# Patient Record
Sex: Male | Born: 1980 | Race: Black or African American | Hispanic: No | Marital: Single | Smoking: Current every day smoker
Health system: Southern US, Community
[De-identification: ages and names within clinical notes are randomized; demographics above are authoritative.]

## PROBLEM LIST (undated history)

## (undated) ENCOUNTER — Emergency Department (HOSPITAL_COMMUNITY): Admission: EM | Payer: Self-pay | Source: Home / Self Care

## (undated) DIAGNOSIS — Z8719 Personal history of other diseases of the digestive system: Secondary | ICD-10-CM

## (undated) DIAGNOSIS — Z8711 Personal history of peptic ulcer disease: Secondary | ICD-10-CM

---

## 1998-11-08 ENCOUNTER — Emergency Department (HOSPITAL_COMMUNITY): Admission: EM | Admit: 1998-11-08 | Discharge: 1998-11-08 | Payer: Self-pay | Admitting: Emergency Medicine

## 1999-04-04 ENCOUNTER — Emergency Department (HOSPITAL_COMMUNITY): Admission: EM | Admit: 1999-04-04 | Discharge: 1999-04-04 | Payer: Self-pay | Admitting: Emergency Medicine

## 2002-06-23 ENCOUNTER — Encounter: Payer: Self-pay | Admitting: Emergency Medicine

## 2002-06-23 ENCOUNTER — Emergency Department (HOSPITAL_COMMUNITY): Admission: EM | Admit: 2002-06-23 | Discharge: 2002-06-23 | Payer: Self-pay | Admitting: Emergency Medicine

## 2003-10-07 ENCOUNTER — Emergency Department (HOSPITAL_COMMUNITY): Admission: EM | Admit: 2003-10-07 | Discharge: 2003-10-07 | Payer: Self-pay | Admitting: Emergency Medicine

## 2004-03-19 ENCOUNTER — Emergency Department (HOSPITAL_COMMUNITY): Admission: EM | Admit: 2004-03-19 | Discharge: 2004-03-19 | Payer: Self-pay | Admitting: Emergency Medicine

## 2005-07-18 ENCOUNTER — Emergency Department (HOSPITAL_COMMUNITY): Admission: EM | Admit: 2005-07-18 | Discharge: 2005-07-18 | Payer: Self-pay | Admitting: Emergency Medicine

## 2006-02-05 ENCOUNTER — Emergency Department (HOSPITAL_COMMUNITY): Admission: EM | Admit: 2006-02-05 | Discharge: 2006-02-05 | Payer: Self-pay | Admitting: Emergency Medicine

## 2007-08-18 ENCOUNTER — Emergency Department (HOSPITAL_COMMUNITY): Admission: EM | Admit: 2007-08-18 | Discharge: 2007-08-18 | Payer: Self-pay | Admitting: Emergency Medicine

## 2007-08-31 ENCOUNTER — Emergency Department (HOSPITAL_COMMUNITY): Admission: EM | Admit: 2007-08-31 | Discharge: 2007-08-31 | Payer: Self-pay | Admitting: Emergency Medicine

## 2007-12-23 ENCOUNTER — Emergency Department (HOSPITAL_COMMUNITY): Admission: EM | Admit: 2007-12-23 | Discharge: 2007-12-23 | Payer: Self-pay | Admitting: Emergency Medicine

## 2008-05-04 ENCOUNTER — Inpatient Hospital Stay (HOSPITAL_COMMUNITY): Admission: EM | Admit: 2008-05-04 | Discharge: 2008-05-07 | Payer: Self-pay | Admitting: Emergency Medicine

## 2008-09-03 ENCOUNTER — Emergency Department (HOSPITAL_COMMUNITY): Admission: EM | Admit: 2008-09-03 | Discharge: 2008-09-03 | Payer: Self-pay | Admitting: Emergency Medicine

## 2009-08-29 ENCOUNTER — Emergency Department (HOSPITAL_COMMUNITY): Admission: EM | Admit: 2009-08-29 | Discharge: 2009-08-29 | Payer: Self-pay | Admitting: Emergency Medicine

## 2009-12-13 ENCOUNTER — Emergency Department (HOSPITAL_COMMUNITY): Admission: EM | Admit: 2009-12-13 | Discharge: 2009-12-13 | Payer: Self-pay | Admitting: Emergency Medicine

## 2010-02-20 ENCOUNTER — Emergency Department (HOSPITAL_COMMUNITY): Admission: EM | Admit: 2010-02-20 | Discharge: 2010-02-20 | Payer: Self-pay | Admitting: Family Medicine

## 2010-03-22 ENCOUNTER — Emergency Department (HOSPITAL_COMMUNITY): Admission: EM | Admit: 2010-03-22 | Discharge: 2010-03-22 | Payer: Self-pay | Admitting: Emergency Medicine

## 2011-04-02 NOTE — Consult Note (Signed)
[name], [name]                ACCOUNT NO.:  [id number]   MEDICAL RECORD NO.:  [id number]          PATIENT TYPE:  INP   LOCATION:  6740                         FACILITY:  [facility]   PHYSICIAN:  Jordan Hawks. Elnoria Howard, MD    DATE OF BIRTH:  07-23-1981   DATE OF CONSULTATION:  05/05/2008  DATE OF DISCHARGE:                                 CONSULTATION   REASON FOR CONSULTATION:  Melena and anemia.   REQUIRING PHYSICIAN:  [facility].   HISTORY OF PRESENT ILLNESS:  This is a 30 year old gentleman with a past  medical history of lower back pain who presents to the emergency room  with complaints of melena and also some dizziness.  He did not have any  chest pain or shortness of breath.  He states taking NSAID medication  about 2-3 tablets per day on a t.i.d. basis.  This started last Saturday  and subsequently on Tuesday he started noticing black stools.  The  stools are progressive and now is complaining with dizziness and with  ambulation.  Because of his symptoms, he presented to the emergency room  for further evaluation and treatment.  Subsequently, he is noted to have  a significant anemia with a hemoglobin in the upper 7 range.  Subsequently, he was transfused with 2 units of packed red blood cells  and admitted to the hospital.   PAST MEDICAL HISTORY:  As stated above.   PAST SURGICAL HISTORY:  As stated above.   FAMILY HISTORY:  Noncontributory.   SOCIAL HISTORY:  Negative for alcohol, tobacco, or illicit drug use.   ALLERGIES:  No known drug allergies.   REVIEW OF SYSTEMS:  As stated above in the history of present illness  otherwise negative.   MEDICATIONS:  1. Albuterol.  2. Atrovent.  3. Protonix.  4. Tylenol.  5. Bisacodyl.  6. Zofran.  7. Oxycodone.   PHYSICAL EXAMINATION:  VITAL SIGNS:  Blood pressure is 109/67, heart  rate is 85, respirations 20, and temperature is 98.0.  GENERAL:  The patient is in no acute distress.  Alert and oriented.  HEENT:   Normocephalic and atraumatic.  Extraocular muscles intact.  NECK:  Supple.  No lymphadenopathy.  LUNGS:  Clear to auscultation bilaterally.  CARDIOVASCULAR:  Regular rate and rhythm.  ABDOMEN:  Mildly obese, soft, some tenderness in the epigastric region.  No rebound or rigidity.  Positive bowel sounds.  EXTREMITIES:  No clubbing, cyanosis, or edema.   LABORATORY DATA:  White blood count is 14.2, hemoglobin 7.1, MCV is  86.3, and platelets 260.  Sodium is 141, potassium 3.7, chloride 109,  glucose 112, BUN 12, and creatinine is 1.0.  Urinalysis is negative.  Hemoccult positive.   IMPRESSION:  1. NSAID-induced ulcers and chronic back pain.  2. Constipation.  The patient certainly has a history consistent with      NSAID gastritis.  Further evaluation with an EGD will be required.      The patient continues to feel a little bit weak at this time and I      will transfuse him with you more  units of packed red blood cells as      his hemoglobin only increased 8.5 to 7.1.  He should remain on      Protonix at this time.      Jordan Hawks Elnoria Howard, MD  Electronically Signed     PDH/MEDQ  D:  05/05/2008  T:  05/06/2008  Job:  [id number]

## 2011-04-02 NOTE — H&P (Signed)
[name], [name] NO.:  [id number]   MEDICAL RECORD NO.:  [id number]           PATIENT TYPE:   LOCATION:                                 FACILITY:   PHYSICIAN:  Lucita Ferrara, MD         DATE OF BIRTH:  1981/08/30   DATE OF ADMISSION:  05/04/2008  DATE OF DISCHARGE:                              HISTORY & PHYSICAL   The patient is a 30 year old with no significant past medical history  who presents with a chief complaint of dizziness upon ambulation.  Symptoms have been going on with 3 days of constipation which he tried  to take some Ex-Lax for last night, accompanied by crampy abdominal  pain.  Patient states he has been taking a lot Motrin and muscle  relaxant for diffuse joint pains, and he noticed some black stools  earlier today.  His abdomen feels full, and he feels very dizzy.  He has  some nausea but no vomiting.  Otherwise, his 12-point review of systems  is negative.  He denies any chest pain, shortness of breath, fevers,  chills, weight loss, weakness, changes in appetite.  He denies  hematemesis, hematochezia.   PAST MEDICAL HISTORY:  Chronic back pain.   SOCIAL HISTORY:  Denies drugs, alcohol.  He is current smoker.   ALLERGIES:  No known drug allergies.   MEDICATIONS:  Include naproxen and ibuprofen.  He also takes Vicodin  unknown dose.   REVIEW OF SYSTEMS:  Otherwise negative.   PHYSICAL EXAMINATION:  Generally speaking, the patient is in no acute  distress.  Blood pressure 131/79, pulse 122, respirations 18,  temperature 97.  HEENT:  Normocephalic, atraumatic.  Sclerae anicteric.  NECK:  Supple.  No JVD or carotid bruits.  PERLA.  Extraocular movements  intact.  CARDIOVASCULAR:  S1-S2 tachy, regular rhythm.  LUNGS:  Clear to auscultation bilaterally.  No rhonchi, rales, rales or  wheezes.  ABDOMEN:  Is soft, nondistended.  There is some left lower quadrant mild  tenderness.  Positive bowel sounds.  EXTREMITIES:  No clubbing, cyanosis  or edema.  RECTAL:  Normal tone.  Stool color black, guaiac positive.  NEUROLOGICAL:  Patient is alert and oriented x3.  Nerves II-XII grossly  intact.  The patient was found to be quite anemic in the emergency room;  yet, he refused transfusion at this time.   LABORATORY DATA:  His basic metabolic panel is significant for a high  BUN of 32, creatinine 1, hemoglobin 7.8, hematocrit 23, white count is  14.2, platelets 160.  KUB is negative for obstruction or free peritoneal  air.   ASSESSMENT/PLAN:  A 30 year old otherwise healthy African-American male  who actually takes an excessive amount of ibuprofen/naproxen and  nonsteroidal anti-inflammatories for chronic back pain, presents with  black stools and dizziness, likely secondary to upper GI bleed.  1. Gastrointestinal bleed and acute blood loss anemia that is      symptomatic.  2. Leukocytosis that is likely reactive.  3. Chronic back pain.  4. Tobacco abuse.   PLAN:  Will go ahead and admit  the patient to medical telemetry floor,  monitor his hemodynamic status, type and cross and transfuse, Protonix  drip, GI consultation in the morning for likely endoscopy versus  endoscopy and colonoscopy.  His leukocytosis likely to be reactive.  His  urinalysis not done yet.  We will go ahead and send off urinalysis on  him.  Smoking cessation will be highly advised.  Also, patient will be  advised not to take too many nonsteroidal anti-inflammatories.  The rest  of the plans depend on his progress.  DVT prophylaxis with SCD boots.      Lucita Ferrara, MD  Electronically Signed     RR/MEDQ  D:  05/04/2008  T:  05/04/2008  Job:  [id number]

## 2011-04-02 NOTE — Discharge Summary (Signed)
[name], [name]                ACCOUNT NO.:  [id number]   MEDICAL RECORD NO.:  [id number]          PATIENT TYPE:  INP   LOCATION:  6740                         FACILITY:  [facility]   PHYSICIAN:  Herbie Saxon, MDDATE OF BIRTH:  Jul 10, 1981   DATE OF ADMISSION:  05/04/2008  DATE OF DISCHARGE:  05/07/2008                               DISCHARGE SUMMARY   DISCHARGE DIAGNOSES:  1. Duodenal ulcers.  2. Severe acute blood loss anemia, improved, status post blood      transfusion.  3. History of nonsteroidal antiinflammatory drug abuse.  4. Hypertension, resolved.  5. Leukocytosis, resolved.  6. History of tobacco abuse.  7. Thrombocytopenia, improved.  8. Remote history of gastritis.   CONSULTATIONS:  Jordan Hawks. Elnoria Howard, MD, Gastroenterology.   PROCEDURE:  Upper endoscopy was performed on 05/06/2008, which showed  normal esophagus, normal stomach, but several superficial clean-based  duodenal bulb ulcers, consistent with NSAIDs abuse.   HOSPITAL COURSE:  This is a 30 year old African American male presented  to the emergency room with dark tarry stools and dizziness, found on  presentation to have hemoglobin of 7.  The patient does have a history  of ibuprofen, naproxen, and Robaxin use for muscular pain and he also  admits to smoking tobacco cigarette.  Counseled extensively on the need  for tobacco cessation.  We started on IV fluid normal saline at 100 mL  an hour, to try to ameliorate his hypertension.  Transfused with 2 units  of packed red blood cells with iv lasixcoverage.  The patient has been  tolerating regular diet.  Hypokalemia has been repleted and hemoglobin  is staying stable. .  He had a total of 4 units of packed red blood cell  transfused on this admission.   DISCHARGE CONDITION:  Stable.   DIET:  Regular.   ACTIVITY:  No restrictions.   FOLLOWUP:  Followup with Dr. Artis Flock in one week.  Followup with Dr. Elnoria Howard,  GI in 4-6 weeks.   DISCHARGE MEDICATIONS:  1. Protonix 40 mg twice daily.  2. Carafate 1 g thrice daily.  3. Hematinic Plus  daily.  4. Robaxin 500 mg q.8 hours p.r.n. for body pain.  The patient to      avoid NSAIDs.   PHYSICAL EXAMINATION:  VITAL SIGNS:  On examination, he is a young man  who is in no acute distress.  Temperature is 98, pulse is 62,  respiratory rate 16, and blood pressure 116/69.  Clinically not  jaundiced.  NECK:  Supple.  HEENT:  Oropharynx and nasopharynx are clear.  Mucous membranes and  moist.  CHEST:  Clinically clear.  HEART:  Heart sounds 1 and 2, regular rate and rhythm.  No gallop.  No  rub or murmur.  ABDOMEN:  Soft and nontender.  No organomegaly.  Bowel sounds are  normoactive.  NEUROLOGIC:  He is alert and oriented in time, place, and person.  Peripheral pulses present.  No pedal edema.  Cranial nerves II through  XII intact.  Power is 5 globally.  Deep tendon reflex is 2+ globally.   LABORATORY DATA:  Available labs showed a WBC of 6, hematocrit 30, and  platelet count 104.  Urine cultures negative.  Chemistry shows a sodium  of 138, potassium 4.1, chloride 109, bicarbonate 25, and glucose 106,  BUN 10, and creatinine 0.9.      Herbie Saxon, MD  Electronically Signed     MIO/MEDQ  D:  05/07/2008  T:  05/08/2008  Job:  [id number]   cc:   Rodolph Bong, M.D.

## 2011-04-05 ENCOUNTER — Emergency Department (HOSPITAL_COMMUNITY)
Admission: EM | Admit: 2011-04-05 | Discharge: 2011-04-06 | Disposition: A | Discharge status: Home / Self Care | Payer: Self-pay | Attending: Emergency Medicine | Admitting: Emergency Medicine

## 2011-04-05 DIAGNOSIS — K5900 Constipation, unspecified: Secondary | ICD-10-CM | POA: Insufficient documentation

## 2011-04-05 DIAGNOSIS — R1013 Epigastric pain: Secondary | ICD-10-CM | POA: Insufficient documentation

## 2011-04-05 DIAGNOSIS — K219 Gastro-esophageal reflux disease without esophagitis: Secondary | ICD-10-CM | POA: Insufficient documentation

## 2011-04-06 ENCOUNTER — Emergency Department (HOSPITAL_COMMUNITY): Payer: Self-pay

## 2011-04-06 ENCOUNTER — Emergency Department (HOSPITAL_COMMUNITY)
Admission: EM | Admit: 2011-04-06 | Discharge: 2011-04-06 | Disposition: A | Discharge status: Home / Self Care | Payer: Self-pay | Attending: Emergency Medicine | Admitting: Emergency Medicine

## 2011-04-06 DIAGNOSIS — W268XXA Contact with other sharp object(s), not elsewhere classified, initial encounter: Secondary | ICD-10-CM | POA: Insufficient documentation

## 2011-04-06 DIAGNOSIS — S61209A Unspecified open wound of unspecified finger without damage to nail, initial encounter: Secondary | ICD-10-CM | POA: Insufficient documentation

## 2011-04-06 LAB — DIFFERENTIAL
Basophils Absolute: 0 10*3/uL (ref 0.0–0.1)
Basophils Relative: 1 % (ref 0–1)
Eosinophils Absolute: 0.4 10*3/uL (ref 0.0–0.7)
Eosinophils Relative: 4 % (ref 0–5)
Lymphocytes Relative: 21 % (ref 12–46)
Lymphs Abs: 1.8 10*3/uL (ref 0.7–4.0)
Monocytes Absolute: 0.7 10*3/uL (ref 0.1–1.0)
Monocytes Relative: 8 % (ref 3–12)
Neutro Abs: 5.8 10*3/uL (ref 1.7–7.7)
Neutrophils Relative %: 66 % (ref 43–77)

## 2011-04-06 LAB — URINALYSIS, ROUTINE W REFLEX MICROSCOPIC
Bilirubin Urine: NEGATIVE
Glucose, UA: NEGATIVE mg/dL
Hgb urine dipstick: NEGATIVE
Ketones, ur: NEGATIVE mg/dL
Nitrite: NEGATIVE
Protein, ur: NEGATIVE mg/dL
Specific Gravity, Urine: 1.018 (ref 1.005–1.030)
Urobilinogen, UA: 0.2 mg/dL (ref 0.0–1.0)
pH: 6.5 (ref 5.0–8.0)

## 2011-04-06 LAB — BASIC METABOLIC PANEL
BUN: 10 mg/dL (ref 6–23)
CO2: 26 mEq/L (ref 19–32)
Calcium: 9.1 mg/dL (ref 8.4–10.5)
Chloride: 105 mEq/L (ref 96–112)
Creatinine, Ser: 0.83 mg/dL (ref 0.4–1.5)
GFR calc Af Amer: >60 mL/min (ref >60)
GFR calc non Af Amer: >60 mL/min (ref >60)
Glucose, Bld: 102 mg/dL — ABNORMAL HIGH (ref 70–99)
Potassium: 3.6 mEq/L (ref 3.5–5.1)
Sodium: 139 mEq/L (ref 135–145)

## 2011-04-06 LAB — CBC
HCT: 42 % (ref 39.0–52.0)
Hemoglobin: 14.7 g/dL (ref 13.0–17.0)
MCH: 29.7 pg (ref 26.0–34.0)
MCHC: 35 g/dL (ref 30.0–36.0)
MCV: 84.8 fL (ref 78.0–100.0)
Platelets: 156 10*3/uL (ref 150–400)
RBC: 4.95 MIL/uL (ref 4.22–5.81)
RDW: 12.6 % (ref 11.5–15.5)
WBC: 8.8 10*3/uL (ref 4.0–10.5)

## 2011-07-24 ENCOUNTER — Emergency Department (HOSPITAL_COMMUNITY)
Admission: EM | Admit: 2011-07-24 | Discharge: 2011-07-25 | Disposition: A | Discharge status: Home / Self Care | Payer: Self-pay | Attending: Emergency Medicine | Admitting: Emergency Medicine

## 2011-07-24 DIAGNOSIS — Z8711 Personal history of peptic ulcer disease: Secondary | ICD-10-CM | POA: Insufficient documentation

## 2011-07-24 DIAGNOSIS — K922 Gastrointestinal hemorrhage, unspecified: Secondary | ICD-10-CM | POA: Insufficient documentation

## 2011-07-24 DIAGNOSIS — R195 Other fecal abnormalities: Secondary | ICD-10-CM | POA: Insufficient documentation

## 2011-07-24 LAB — OCCULT BLOOD, POC DEVICE: Fecal Occult Bld: POSITIVE

## 2011-07-25 LAB — CBC
HCT: 36 % — ABNORMAL LOW (ref 39.0–52.0)
Hemoglobin: 12.5 g/dL — ABNORMAL LOW (ref 13.0–17.0)
MCH: 29.2 pg (ref 26.0–34.0)
MCHC: 34.7 g/dL (ref 30.0–36.0)
MCV: 84.1 fL (ref 78.0–100.0)
Platelets: 148 10*3/uL — ABNORMAL LOW (ref 150–400)
RBC: 4.28 MIL/uL (ref 4.22–5.81)
RDW: 13 % (ref 11.5–15.5)
WBC: 7.2 10*3/uL (ref 4.0–10.5)

## 2011-07-25 LAB — COMPREHENSIVE METABOLIC PANEL
ALT: 14 U/L (ref 0–53)
AST: 15 U/L (ref 0–37)
Albumin: 3.3 g/dL — ABNORMAL LOW (ref 3.5–5.2)
Alkaline Phosphatase: 35 U/L — ABNORMAL LOW (ref 39–117)
BUN: 19 mg/dL (ref 6–23)
CO2: 26 mEq/L (ref 19–32)
Calcium: 8.7 mg/dL (ref 8.4–10.5)
Chloride: 108 mEq/L (ref 96–112)
Creatinine, Ser: 0.93 mg/dL (ref 0.50–1.35)
GFR calc Af Amer: >60 mL/min (ref >60)
GFR calc non Af Amer: >60 mL/min (ref >60)
Glucose, Bld: 96 mg/dL (ref 70–99)
Potassium: 3.7 mEq/L (ref 3.5–5.1)
Sodium: 141 mEq/L (ref 135–145)
Total Bilirubin: 0.2 mg/dL — ABNORMAL LOW (ref 0.3–1.2)
Total Protein: 5.7 g/dL — ABNORMAL LOW (ref 6.0–8.3)

## 2011-07-25 LAB — LIPASE, BLOOD: Lipase: 58 U/L (ref 11–59)

## 2011-08-15 LAB — BASIC METABOLIC PANEL
BUN: 10
CO2: 25
Calcium: 8 — ABNORMAL LOW
Chloride: 109
Creatinine, Ser: 0.91
GFR calc Af Amer: >60
GFR calc non Af Amer: >60
Glucose, Bld: 106 — ABNORMAL HIGH
Potassium: 3.4 — ABNORMAL LOW
Sodium: 138

## 2011-08-15 LAB — DIFFERENTIAL
Basophils Absolute: 0
Basophils Relative: 0
Eosinophils Absolute: 0
Eosinophils Relative: 0
Lymphocytes Relative: 13
Lymphs Abs: 1.8
Monocytes Absolute: 1
Monocytes Relative: 7
Neutro Abs: 11.4 — ABNORMAL HIGH
Neutrophils Relative %: 80 — ABNORMAL HIGH

## 2011-08-15 LAB — URINALYSIS, ROUTINE W REFLEX MICROSCOPIC
Bilirubin Urine: NEGATIVE
Glucose, UA: NEGATIVE
Hgb urine dipstick: NEGATIVE
Ketones, ur: NEGATIVE
Nitrite: NEGATIVE
Protein, ur: NEGATIVE
Specific Gravity, Urine: 1.016
Urobilinogen, UA: 0.2
pH: 5

## 2011-08-15 LAB — URINE CULTURE
Colony Count: NO GROWTH
Culture: NO GROWTH
Special Requests: NEGATIVE

## 2011-08-15 LAB — POCT I-STAT, CHEM 8
BUN: 32 — ABNORMAL HIGH
Calcium, Ion: 1.14
Chloride: 109
Creatinine, Ser: 1
Glucose, Bld: 112 — ABNORMAL HIGH
HCT: 23 — ABNORMAL LOW
Hemoglobin: 7.8 — CL
Potassium: 3.7
Sodium: 141
TCO2: 22

## 2011-08-15 LAB — CROSSMATCH
ABO/RH(D): B POS
Antibody Screen: NEGATIVE

## 2011-08-15 LAB — HEMOGLOBIN AND HEMATOCRIT, BLOOD
HCT: 20.9 — ABNORMAL LOW
HCT: 22.6 — ABNORMAL LOW
HCT: 24.4 — ABNORMAL LOW
HCT: 25.3 — ABNORMAL LOW
HCT: 31.7 — ABNORMAL LOW
Hemoglobin: 10.9 — ABNORMAL LOW
Hemoglobin: 7.1 — CL
Hemoglobin: 7.9 — CL
Hemoglobin: 8.5 — ABNORMAL LOW
Hemoglobin: 8.7 — ABNORMAL LOW

## 2011-08-15 LAB — CBC
HCT: 22.9 — ABNORMAL LOW
HCT: 30.1 — ABNORMAL LOW
Hemoglobin: 10.4 — ABNORMAL LOW
Hemoglobin: 8 — ABNORMAL LOW
MCHC: 34.6
MCHC: 35
MCV: 86.3
MCV: 87.8
Platelets: 104 — ABNORMAL LOW
Platelets: 160
RBC: 2.66 — ABNORMAL LOW
RBC: 3.43 — ABNORMAL LOW
RDW: 12.7
RDW: 13.9
WBC: 14.2 — ABNORMAL HIGH
WBC: 6.2

## 2011-08-15 LAB — OCCULT BLOOD X 1 CARD TO LAB, STOOL: Fecal Occult Bld: POSITIVE

## 2011-08-15 LAB — ABO/RH: ABO/RH(D): B POS

## 2011-08-15 LAB — POTASSIUM: Potassium: 4.1

## 2011-08-15 LAB — PREPARE RBC (CROSSMATCH)

## 2011-08-29 LAB — RAPID STREP SCREEN (MED CTR MEBANE ONLY): Streptococcus, Group A Screen (Direct): NEGATIVE

## 2012-12-16 IMAGING — CR DG ABDOMEN ACUTE W/ 1V CHEST
3 series · 3 of 3 positions shown · non-contrast
Comparison: Chest and two views abdomen 05/04/2008.

CLINICAL DATA: Abdominal pain.

ACUTE ABDOMEN SERIES (ABDOMEN 2 VIEW & CHEST 1 VIEW)

[w chest pa]
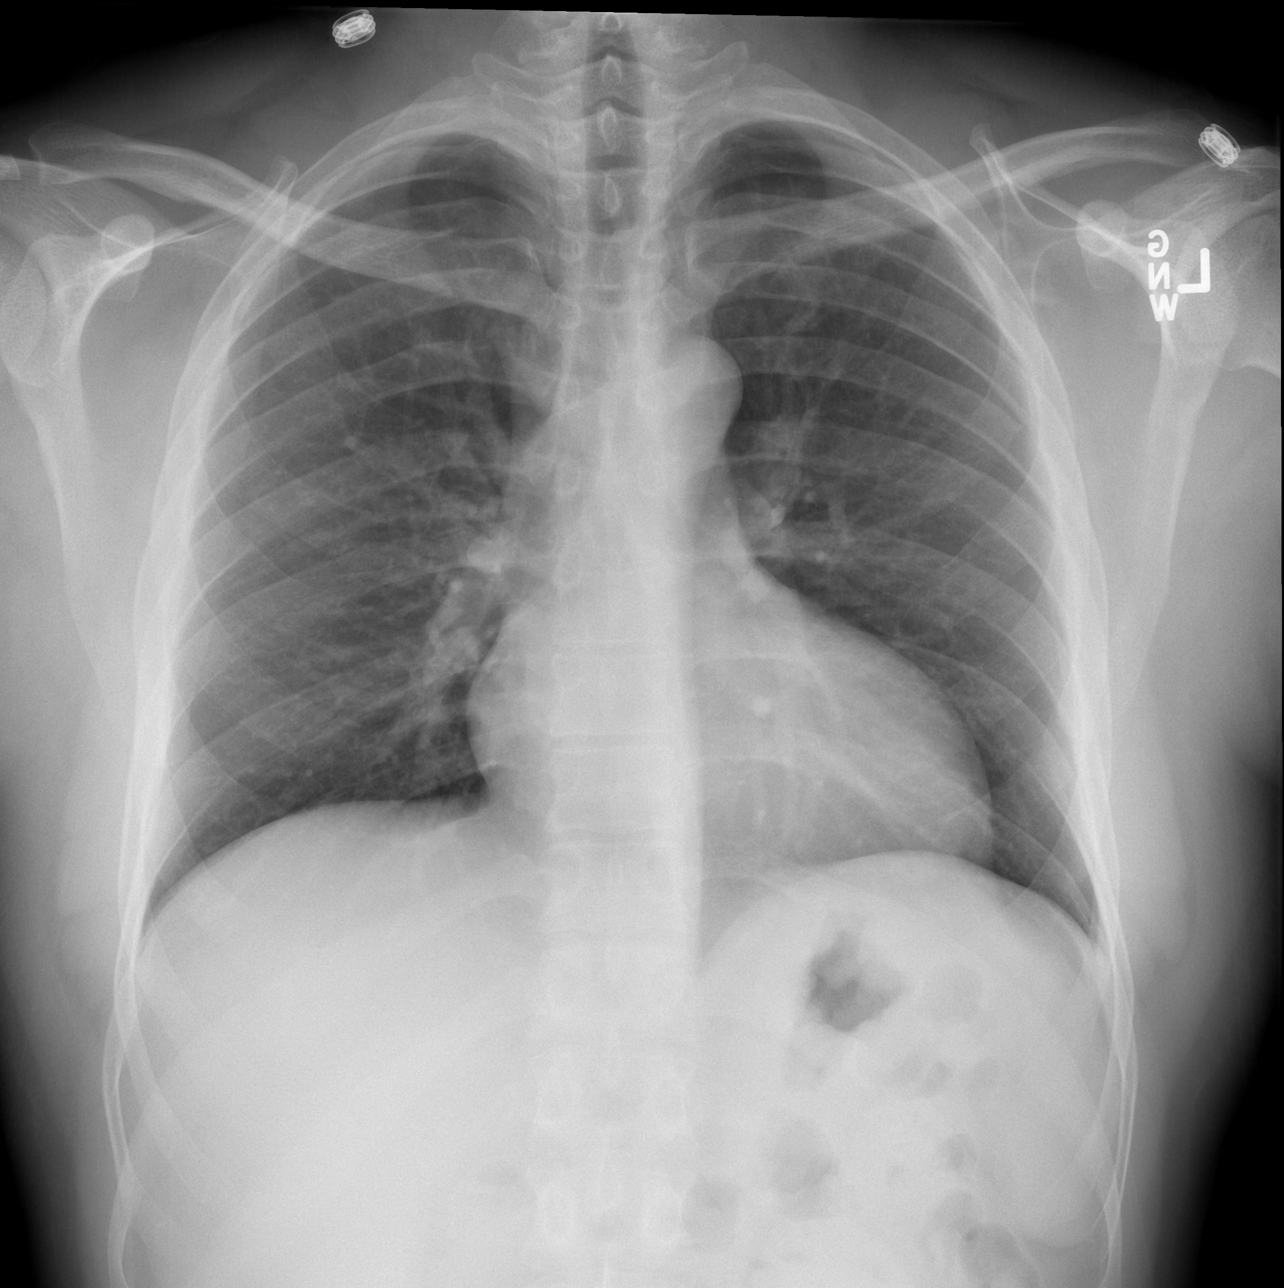

[w abdomen upright]
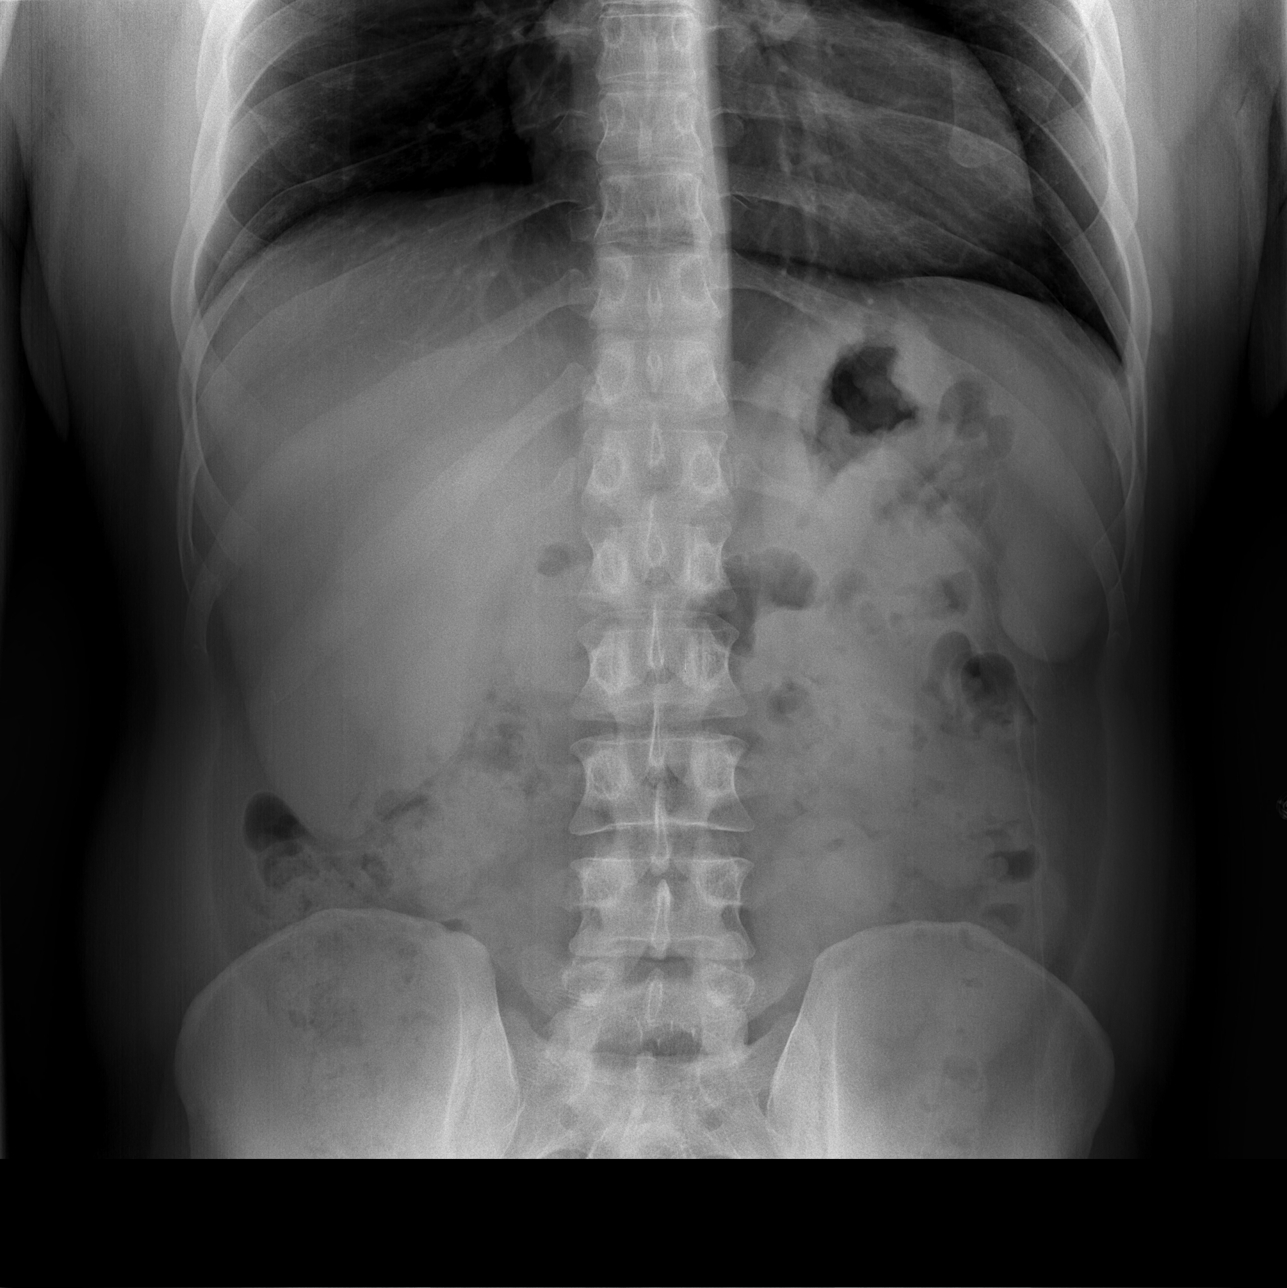

[t abdomen supine]
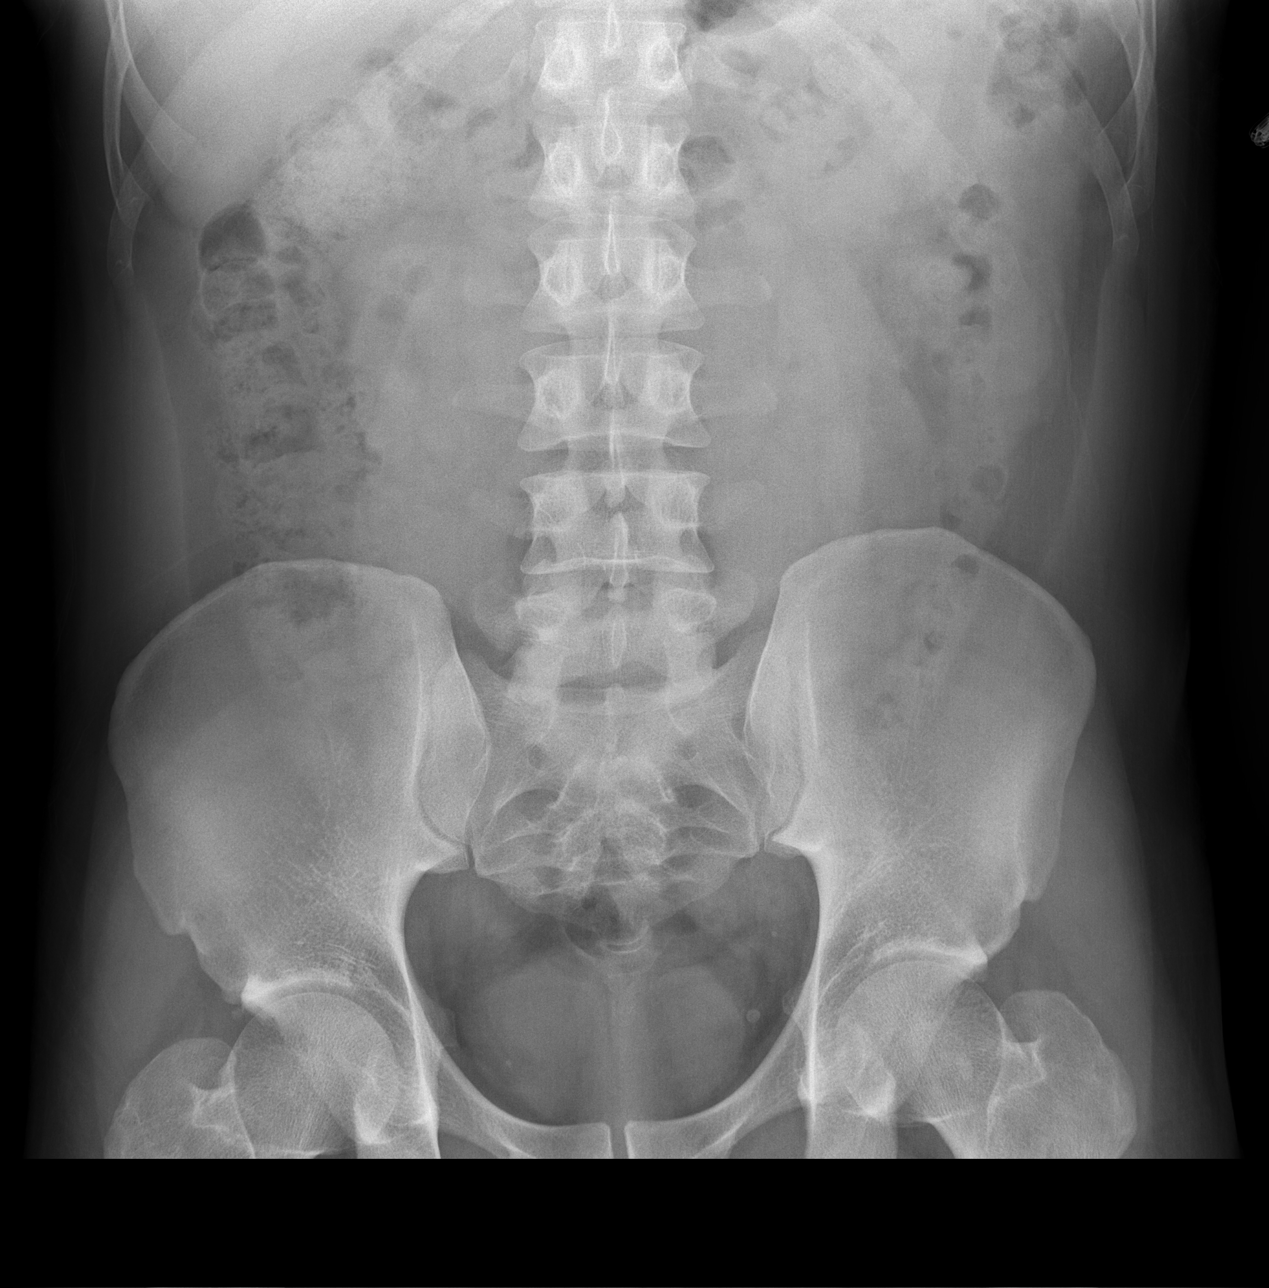

[3 of 3 positions shown; findings below may reference images not displayed]

FINDINGS: Single view of the chest demonstrates clear lungs and
normal heart size.  No pneumothorax or pleural effusion.

Two views of the abdomen show no free intraperitoneal air.  Bowel
gas pattern is unremarkable.  No unexpected abdominal
calcification.
IMPRESSION: Negative exam.

## 2013-04-24 ENCOUNTER — Emergency Department (HOSPITAL_COMMUNITY)
Admission: EM | Admit: 2013-04-24 | Discharge: 2013-04-24 | Disposition: A | Discharge status: Home / Self Care | Payer: Self-pay | Attending: Emergency Medicine | Admitting: Emergency Medicine

## 2013-04-24 DIAGNOSIS — R369 Urethral discharge, unspecified: Secondary | ICD-10-CM | POA: Insufficient documentation

## 2013-04-24 DIAGNOSIS — A64 Unspecified sexually transmitted disease: Secondary | ICD-10-CM | POA: Insufficient documentation

## 2013-04-24 MED ORDER — CEFTRIAXONE SODIUM 250 MG IJ SOLR
250.0000 mg | Freq: Once | INTRAMUSCULAR | Status: AC
  Administered 2013-04-24: 250 mg via INTRAMUSCULAR
  Filled 2013-04-24: qty 250

## 2013-04-24 MED ORDER — AZITHROMYCIN 250 MG PO TABS
1000.0000 mg | ORAL_TABLET | Freq: Once | ORAL | Status: AC
  Administered 2013-04-24: 1000 mg via ORAL
  Filled 2013-04-24: qty 4

## 2013-04-24 MED ORDER — LIDOCAINE HCL (PF) 1 % IJ SOLN
INTRAMUSCULAR | Status: AC
  Administered 2013-04-24: 0.9 mL
  Filled 2013-04-24: qty 5

## 2013-04-24 MED ORDER — METRONIDAZOLE 500 MG PO TABS
2000.0000 mg | ORAL_TABLET | Freq: Once | ORAL | Status: AC
  Administered 2013-04-24: 2000 mg via ORAL
  Filled 2013-04-24: qty 4

## 2013-04-24 NOTE — ED Provider Notes (Signed)
History     CSN: [id number]  Arrival date & time 04/24/13  1191   First MD Initiated Contact with Patient 04/24/13 2055      Chief Complaint  Patient presents with  . Penile Discharge   HPI  History provided by the patient. The patient is a 32 year old male with no significant PMH who presents with complaints of penile discharge and dysuria. Symptoms began 4 days ago and have been persistent with each episode of voiding. Patient is sexually active and does not always use protection. He denies any swelling of the penis or scrotum. No rash. Denies any abdominal or flank pain. No associated fever, chills or sweats. No other aggravating or alleviating factors. No other associated symptoms.    No past medical history on file.  No past surgical history on file.  No family history on file.  History  Substance Use Topics  . Smoking status: Not on file  . Smokeless tobacco: Not on file  . Alcohol Use: Not on file      Review of Systems  Constitutional: Negative for fever, chills and diaphoresis.  Gastrointestinal: Negative for nausea, vomiting and abdominal pain.  Genitourinary: Positive for dysuria and discharge. Negative for frequency, hematuria and flank pain.  All other systems reviewed and are negative.    Allergies  Review of patient's allergies indicates no known allergies.  Home Medications  No current outpatient prescriptions on file.  BP 123/71  Pulse 93  Temp(Src) 99.1 F (37.3 C) (Oral)  Resp 18  SpO2 98%  Physical Exam  Nursing note and vitals reviewed. Constitutional: He is oriented to person, place, and time. He appears well-developed and well-nourished.  HENT:  Head: Normocephalic.  Cardiovascular: Normal rate and regular rhythm.   Pulmonary/Chest: Effort normal and breath sounds normal. No respiratory distress. He has no wheezes. He has no rales.  Abdominal: Soft. There is no tenderness. There is no rebound and no guarding.  Musculoskeletal: Normal  range of motion.  Neurological: He is alert and oriented to person, place, and time.  Skin: Skin is warm. No rash noted. No erythema.  Psychiatric: He has a normal mood and affect. His behavior is normal.    ED Course  Procedures   Labs Reviewed  GC/CHLAMYDIA PROBE AMP    Medications administered Medications  cefTRIAXone (ROCEPHIN) injection 250 mg (250 mg Intramuscular Given 04/24/13 2137)  azithromycin (ZITHROMAX) tablet 1,000 mg (1,000 mg Oral Given 04/24/13 2136)  metroNIDAZOLE (FLAGYL) tablet 2,000 mg (2,000 mg Oral Given 04/24/13 2136)  lidocaine (PF) (XYLOCAINE) 1 % injection (0.9 mLs  Given 04/24/13 2137)       1. Penile discharge   2. STD (male)       MDM  9:00PM patient seen and evaluated. Patient appears well in no acute distress.      Angus Seller, PA-C 04/25/13 [phone]

## 2013-04-24 NOTE — ED Notes (Addendum)
Pt c/o penile discharge x 4 days, with burning sensation during voiding. Pt states frequent urge to void

## 2013-04-25 NOTE — ED Provider Notes (Signed)
Medical screening examination/treatment/procedure(s) were performed by non-physician practitioner and as supervising physician I was immediately available for consultation/collaboration.    Nelia Shi, MD 04/25/13 [phone]

## 2013-04-26 LAB — GC/CHLAMYDIA PROBE AMP
CT Probe RNA: NEGATIVE
GC Probe RNA: NEGATIVE

## 2013-10-03 ENCOUNTER — Emergency Department (HOSPITAL_COMMUNITY)
Admission: EM | Admit: 2013-10-03 | Discharge: 2013-10-03 | Disposition: A | Discharge status: Home / Self Care | Payer: Self-pay | Attending: Emergency Medicine | Admitting: Emergency Medicine

## 2013-10-03 ENCOUNTER — Encounter (HOSPITAL_COMMUNITY): Payer: Self-pay | Admitting: Emergency Medicine

## 2013-10-03 DIAGNOSIS — Z8619 Personal history of other infectious and parasitic diseases: Secondary | ICD-10-CM | POA: Insufficient documentation

## 2013-10-03 DIAGNOSIS — F17200 Nicotine dependence, unspecified, uncomplicated: Secondary | ICD-10-CM | POA: Insufficient documentation

## 2013-10-03 DIAGNOSIS — N342 Other urethritis: Secondary | ICD-10-CM | POA: Insufficient documentation

## 2013-10-03 DIAGNOSIS — Z8719 Personal history of other diseases of the digestive system: Secondary | ICD-10-CM | POA: Insufficient documentation

## 2013-10-03 HISTORY — DX: Personal history of peptic ulcer disease: Z87.11

## 2013-10-03 HISTORY — DX: Personal history of other diseases of the digestive system: Z87.19

## 2013-10-03 LAB — URINALYSIS, ROUTINE W REFLEX MICROSCOPIC
Bilirubin Urine: NEGATIVE
Glucose, UA: NEGATIVE mg/dL
Hgb urine dipstick: NEGATIVE
Ketones, ur: NEGATIVE mg/dL
Nitrite: NEGATIVE
Protein, ur: NEGATIVE mg/dL
Specific Gravity, Urine: 1.017 (ref 1.005–1.030)
Urobilinogen, UA: 0.2 mg/dL (ref 0.0–1.0)
pH: 6.5 (ref 5.0–8.0)

## 2013-10-03 LAB — URINE MICROSCOPIC-ADD ON

## 2013-10-03 MED ORDER — AZITHROMYCIN 1 G PO PACK
1.0000 g | PACK | Freq: Once | ORAL | Status: AC
  Administered 2013-10-03: 1 g via ORAL
  Filled 2013-10-03: qty 1

## 2013-10-03 MED ORDER — LIDOCAINE HCL (PF) 2 % IJ SOLN
2.0000 mL | Freq: Once | INTRAMUSCULAR | Status: AC
  Administered 2013-10-03: 2 mL

## 2013-10-03 MED ORDER — CEFTRIAXONE SODIUM 250 MG IJ SOLR
250.0000 mg | Freq: Once | INTRAMUSCULAR | Status: AC
  Administered 2013-10-03: 250 mg via INTRAMUSCULAR
  Filled 2013-10-03: qty 250

## 2013-10-03 NOTE — ED Notes (Signed)
Pt states that he has been having discharge from his penis for 3 days. Pt wants to be checked. Pt denies burning or blood with urination.

## 2013-10-03 NOTE — ED Notes (Signed)
Pt given d/c instructions and verbalized understanding. NAD at this time.  

## 2013-10-03 NOTE — ED Provider Notes (Signed)
CSN: [id number]     Arrival date & time 10/03/13  1951 History   First MD Initiated Contact with Patient 10/03/13 2007     Chief Complaint  Patient presents with  . SEXUALLY TRANSMITTED DISEASE   (Consider location/radiation/quality/duration/timing/severity/associated sxs/prior Treatment) HPI Comments: Pt started having penile discharge 3 days ago:pt states that he has a history of an std a couple of months ago:pt states that he has been using protection but this time it broke:denies any urinary symptoms or abdominal pain  The history is provided by the patient. No language interpreter was used.    Past Medical History  Diagnosis Date  . History of stomach ulcers    History reviewed. No pertinent past surgical history. History reviewed. No pertinent family history. History  Substance Use Topics  . Smoking status: Current Every Day Smoker  . Smokeless tobacco: Not on file  . Alcohol Use: Yes    Review of Systems  Constitutional: Negative.   Respiratory: Negative.   Cardiovascular: Negative.     Allergies  Review of patient's allergies indicates no known allergies.  Home Medications   Current Outpatient Rx  Name  Route  Sig  Dispense  Refill  . ibuprofen (ADVIL,MOTRIN) 200 MG tablet   Oral   Take 400 mg by mouth every 6 (six) hours as needed for headache or moderate pain.          BP 140/85  Pulse 70  Temp(Src) 98.2 F (36.8 C) (Oral)  Resp 16  Wt 161 lb 3.2 oz (73.12 kg)  SpO2 100% Physical Exam  Nursing note and vitals reviewed. Constitutional: He is oriented to person, place, and time. He appears well-developed and well-nourished.  Cardiovascular: Normal rate and regular rhythm.   Pulmonary/Chest: Effort normal and breath sounds normal.  Abdominal: Soft. Bowel sounds are normal. There is no tenderness.  Genitourinary:  Clear penile discharge  Musculoskeletal: Normal range of motion.  Neurological: He is alert and oriented to person, place, and time.     ED Course  Procedures (including critical care time) Labs Review Labs Reviewed  URINALYSIS, ROUTINE W REFLEX MICROSCOPIC - Abnormal; Notable for the following:    Leukocytes, UA SMALL (*)    All other components within normal limits  GC/CHLAMYDIA PROBE AMP  URINE MICROSCOPIC-ADD ON   Imaging Review No results found.  EKG Interpretation   None       MDM   1. Urethritis    Pt treated with zithromax and rocephin for std based on exam   Teressa Lower, NP 10/03/13 2135

## 2013-10-04 LAB — GC/CHLAMYDIA PROBE AMP
CT Probe RNA: NEGATIVE
GC Probe RNA: NEGATIVE

## 2013-10-05 NOTE — ED Provider Notes (Signed)
History/physical exam/procedure(s) were performed by non-physician practitioner and as supervising physician I was immediately available for consultation/collaboration. I have reviewed all notes and am in agreement with care and plan.   Hilario Quarry, MD 10/05/13 [phone]

## 2013-10-21 ENCOUNTER — Emergency Department (HOSPITAL_COMMUNITY)
Admission: EM | Admit: 2013-10-21 | Discharge: 2013-10-21 | Disposition: A | Discharge status: Home / Self Care | Payer: Self-pay | Attending: Emergency Medicine | Admitting: Emergency Medicine

## 2013-10-21 ENCOUNTER — Encounter (HOSPITAL_COMMUNITY): Payer: Self-pay | Admitting: Emergency Medicine

## 2013-10-21 DIAGNOSIS — F17200 Nicotine dependence, unspecified, uncomplicated: Secondary | ICD-10-CM | POA: Insufficient documentation

## 2013-10-21 DIAGNOSIS — N341 Nonspecific urethritis: Secondary | ICD-10-CM | POA: Insufficient documentation

## 2013-10-21 DIAGNOSIS — Z8719 Personal history of other diseases of the digestive system: Secondary | ICD-10-CM | POA: Insufficient documentation

## 2013-10-21 LAB — URINALYSIS, ROUTINE W REFLEX MICROSCOPIC
Bilirubin Urine: NEGATIVE
Glucose, UA: NEGATIVE mg/dL
Hgb urine dipstick: NEGATIVE
Ketones, ur: NEGATIVE mg/dL
Nitrite: NEGATIVE
Protein, ur: NEGATIVE mg/dL
Specific Gravity, Urine: 1.017 (ref 1.005–1.030)
Urobilinogen, UA: 0.2 mg/dL (ref 0.0–1.0)
pH: 5 (ref 5.0–8.0)

## 2013-10-21 LAB — URINE MICROSCOPIC-ADD ON

## 2013-10-21 MED ORDER — SULFAMETHOXAZOLE-TRIMETHOPRIM 800-160 MG PO TABS: 1.0000 | ORAL_TABLET | Freq: Two times a day (BID) | ORAL | Status: DC

## 2013-10-21 NOTE — ED Notes (Signed)
Pt states he has been seen here for discharge from his penis, states he has not had intercourse, but has masturbated since, and has been having some disharge in the morning only.

## 2013-10-21 NOTE — ED Provider Notes (Signed)
CSN: [id number]     Arrival date & time 10/21/13  1951 History  This chart was scribed for non-physician practitioner Dierdre Forth, PA, working with Raeford Razor, MD by Ronal Fear, ED scribe. This patient was seen in room TR05C/TR05C and the patient's care was started at 8:48 PM.    Chief Complaint  Patient presents with  . Penile Discharge   (Consider location/radiation/quality/duration/timing/severity/associated sxs/prior Treatment) Patient is a 32 y.o. male presenting with penile discharge. The history is provided by the patient and medical records. No language interpreter was used.  Penile Discharge This is a recurrent problem. The current episode started more than 2 days ago. The problem occurs daily. The problem has been gradually improving. Pertinent negatives include no chest pain, no abdominal pain, no headaches and no shortness of breath. Nothing aggravates the symptoms. Nothing relieves the symptoms.  Penile Discharge This is a recurrent problem. The current episode started more than 2 days ago. The problem occurs daily. The problem has been gradually improving. Pertinent negatives include no abdominal pain, chest pain, chills, fatigue, fever, headaches, nausea, rash or vomiting. Nothing aggravates the symptoms.  HPI Comments: [name] is a 32 y.o. male who presents to the Emergency Department complaining of penile discharge that began last week but has not completely resolved. He denies intercourse with the partner he contracted the infection from.  He states that the discharge present in the morning after waking up, with associated dysuria and cloudy urine only at he first urination.  He does not appear to be in any acute distress with no other complaints. He denies fever, chills, headache, neck pain, chest pain abdominal pain, nausea, vomiting, diarrhea, weakness, dizziness, syncope, hematuria.   Past Medical History  Diagnosis Date  . History of stomach ulcers     No past surgical history on file. History reviewed. No pertinent family history. History  Substance Use Topics  . Smoking status: Current Every Day Smoker  . Smokeless tobacco: Not on file  . Alcohol Use: Yes    Review of Systems  Constitutional: Negative for fever, chills and fatigue.  Respiratory: Negative for shortness of breath.   Cardiovascular: Negative for chest pain.  Gastrointestinal: Negative for nausea, vomiting and abdominal pain.  Endocrine: Negative for polydipsia, polyphagia and polyuria.  Genitourinary: Positive for dysuria and discharge. Negative for scrotal swelling, genital sores, penile pain and testicular pain.  Musculoskeletal: Negative for back pain.  Skin: Negative for rash.  Allergic/Immunologic: Negative for immunocompromised state.  Neurological: Negative for headaches.  Hematological: Negative for adenopathy.  Psychiatric/Behavioral: The patient is not nervous/anxious.   All other systems reviewed and are negative.    Allergies  Review of patient's allergies indicates no known allergies.  Home Medications   Current Outpatient Rx  Name  Route  Sig  Dispense  Refill  . sulfamethoxazole-trimethoprim (SEPTRA DS) 800-160 MG per tablet   Oral   Take 1 tablet by mouth every 12 (twelve) hours.   20 tablet   0    BP 136/98  Pulse 80  Temp(Src) 98 F (36.7 C) (Oral)  Resp 18  Ht 5\' 7"  (1.702 m)  Wt 162 lb 8 oz (73.71 kg)  BMI 25.45 kg/m2  SpO2 100% Physical Exam  Nursing note and vitals reviewed. Constitutional: He appears well-developed and well-nourished.  HENT:  Head: Normocephalic and atraumatic.  Mouth/Throat: Oropharynx is clear and moist.  Eyes: Conjunctivae are normal. No scleral icterus.  Cardiovascular: Normal rate, regular rhythm, normal heart sounds and intact distal  pulses.   No murmur heard. Pulmonary/Chest: Effort normal and breath sounds normal. No respiratory distress. He has no wheezes.  Abdominal: Soft. Normal  appearance and bowel sounds are normal. He exhibits no distension and no mass. There is no tenderness. There is no rigidity, no rebound, no guarding and no CVA tenderness. Hernia confirmed negative in the right inguinal area and confirmed negative in the left inguinal area.  No CVA tenderness Abdomen soft and nontender  Genitourinary: Testes normal. Circumcised. No phimosis, paraphimosis, hypospadias, penile erythema or penile tenderness. No discharge found.  Lymphadenopathy:       Right: No inguinal adenopathy present.       Left: No inguinal adenopathy present.  Neurological: He is alert.  Skin: Skin is warm and dry.  Psychiatric: He has a normal mood and affect.    ED Course  Procedures (including critical care time) DIAGNOSTIC STUDIES: Oxygen Saturation is 100% on RA, normal by my interpretation.    COORDINATION OF CARE:   8:51 PM- Pt advised of plan for treatment including antibiotics for his discharge, if the discharge continues then he will receive a referral to a GU specialist and pt agrees.  Labs Review Labs Reviewed  URINALYSIS, ROUTINE W REFLEX MICROSCOPIC - Abnormal; Notable for the following:    Leukocytes, UA TRACE (*)    All other components within normal limits  URINE MICROSCOPIC-ADD ON   Imaging Review No results found.  EKG Interpretation   None       MDM   1. NGU (nongonococcal urethritis)     [name] presents with persistent penile discharge with associated cloudy urine only in the mornings. He denies further intercourse since he was treated.  Record reviewed and patient's gonorrhea and Chlamydia screens were negative.  Patient with leukocytes noted on the urinalysis but no nitrites and rare bacteria.  Likely nongonococcal urethritis potentially from Mycoplasma genitalium. No evidence of trichomonas on the urine.  Patient is without epididymitis, orchitis or symptoms of prostatitis. He is afebrile, tachycardic, nontoxic, nonseptic appearing.   Discussed with pharmacist who agrees that Bactrim is a good choice. Discussed the patient agrees to followup with urology if symptoms persist.  It has been determined that no acute conditions requiring further emergency intervention are present at this time. The patient/guardian have been advised of the diagnosis and plan. We have discussed signs and symptoms that warrant return to the ED, such as changes or worsening in symptoms.   Vital signs are stable at discharge.   BP 136/98  Pulse 80  Temp(Src) 98 F (36.7 C) (Oral)  Resp 18  Ht 5\' 7"  (1.702 m)  Wt 162 lb 8 oz (73.71 kg)  BMI 25.45 kg/m2  SpO2 100%  Patient/guardian has voiced understanding and agreed to follow-up with the PCP or specialist.    I personally performed the services described in this documentation, which was scribed in my presence. The recorded information has been reviewed and is accurate.   Dahlia Client Richard Boorman, PA-C 10/21/13 2118

## 2013-10-25 NOTE — ED Provider Notes (Signed)
Medical screening examination/treatment/procedure(s) were performed by non-physician practitioner and as supervising physician I was immediately available for consultation/collaboration.  EKG Interpretation   None        Mariaisabel Bodiford, MD 10/25/13 0910 

## 2014-07-05 ENCOUNTER — Encounter (HOSPITAL_COMMUNITY): Payer: Self-pay | Admitting: Emergency Medicine

## 2014-07-05 ENCOUNTER — Emergency Department (HOSPITAL_COMMUNITY)
Admission: EM | Admit: 2014-07-05 | Discharge: 2014-07-05 | Disposition: A | Discharge status: Home / Self Care | Payer: No Typology Code available for payment source | Attending: Emergency Medicine | Admitting: Emergency Medicine

## 2014-07-05 DIAGNOSIS — S139XXA Sprain of joints and ligaments of unspecified parts of neck, initial encounter: Secondary | ICD-10-CM | POA: Insufficient documentation

## 2014-07-05 DIAGNOSIS — S0990XA Unspecified injury of head, initial encounter: Secondary | ICD-10-CM | POA: Insufficient documentation

## 2014-07-05 DIAGNOSIS — Y92410 Unspecified street and highway as the place of occurrence of the external cause: Secondary | ICD-10-CM | POA: Insufficient documentation

## 2014-07-05 DIAGNOSIS — F17200 Nicotine dependence, unspecified, uncomplicated: Secondary | ICD-10-CM | POA: Insufficient documentation

## 2014-07-05 DIAGNOSIS — Z8719 Personal history of other diseases of the digestive system: Secondary | ICD-10-CM | POA: Diagnosis not present

## 2014-07-05 DIAGNOSIS — Y9389 Activity, other specified: Secondary | ICD-10-CM | POA: Diagnosis not present

## 2014-07-05 DIAGNOSIS — S0993XA Unspecified injury of face, initial encounter: Secondary | ICD-10-CM | POA: Insufficient documentation

## 2014-07-05 DIAGNOSIS — IMO0002 Reserved for concepts with insufficient information to code with codable children: Secondary | ICD-10-CM | POA: Diagnosis not present

## 2014-07-05 DIAGNOSIS — S199XXA Unspecified injury of neck, initial encounter: Secondary | ICD-10-CM | POA: Insufficient documentation

## 2014-07-05 DIAGNOSIS — S161XXA Strain of muscle, fascia and tendon at neck level, initial encounter: Secondary | ICD-10-CM

## 2014-07-05 MED ORDER — CYCLOBENZAPRINE HCL 10 MG PO TABS: 10.0000 mg | ORAL_TABLET | Freq: Two times a day (BID) | ORAL | Status: DC | PRN

## 2014-07-05 MED ORDER — HYDROCODONE-ACETAMINOPHEN 5-325 MG PO TABS: 1.0000 | ORAL_TABLET | Freq: Four times a day (QID) | ORAL | Status: DC | PRN
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
## 2014-07-05 NOTE — ED Notes (Signed)
Paged Ortho for Collar. None available in [facility].

## 2014-07-05 NOTE — ED Notes (Signed)
NP at the bedside

## 2014-07-05 NOTE — Progress Notes (Signed)
Orthopedic Tech Progress Note Patient Details:  [name] 03/17/1981 [id number]  Ortho Devices Type of Ortho Device: Soft collar Ortho Device/Splint Location: neck Ortho Device/Splint Interventions: Ordered;Application   Jennye MoccasinHughes, Tod Abrahamsen Craig 07/05/2014, 6:22 PM

## 2014-07-05 NOTE — Discharge Instructions (Signed)
Cervical Strain and Sprain (Whiplash)  Cervical strain and sprain are injuries that commonly occur with "whiplash" injuries. Whiplash occurs when the neck is forcefully whipped backward or forward, such as during a motor vehicle accident or during contact sports. The muscles, ligaments, tendons, discs, and nerves of the neck are susceptible to injury when this occurs. RISK FACTORS Risk of having a whiplash injury increases if:  Osteoarthritis of the spine.  Situations that make head or neck accidents or trauma more likely.  High-risk sports (football, rugby, wrestling, hockey, auto racing, gymnastics, diving, contact karate, or boxing).  Poor strength and flexibility of the neck.  Previous neck injury.  Poor tackling technique.  Improperly fitted or padded equipment. SYMPTOMS   Pain or stiffness in the front or back of neck or both.  Symptoms may present immediately or up to 24 hours after injury.  Dizziness, headache, nausea, and vomiting.  Muscle spasm with soreness and stiffness in the neck.  Tenderness and swelling at the injury site. PREVENTION  Learn and use proper technique (avoid tackling with the head, spearing, and head-butting; use proper falling techniques to avoid landing on the head).  Warm up and stretch properly before activity.  Maintain physical fitness:  Strength, flexibility, and endurance.  Cardiovascular fitness.  Wear properly fitted and padded protective equipment, such as padded soft collars, for participation in contact sports. PROGNOSIS  Recovery from cervical strain and sprain injuries is dependent on the extent of the injury. These injuries are usually curable in 1 week to 3 months with appropriate treatment.  RELATED COMPLICATIONS   Temporary numbness and weakness may occur if the nerve roots are damaged, and this may persist until the nerve has completely healed.  Chronic pain due to frequent recurrence of symptoms.  Prolonged healing,  especially if activity is resumed too soon (before complete recovery). TREATMENT  Treatment initially involves the use of ice and medication to help reduce pain and inflammation. A soft, padded collar may be recommended to be worn around the neck.  MEDICATION   If pain medication is necessary, nonsteroidal anti-inflammatory medications, such as aspirin and ibuprofen, or other minor pain relievers, such as acetaminophen, are often recommended.  Do not take pain medication for 7 days before surgery.  Prescription pain relievers may be given if deemed necessary by your caregiver. Use only as directed and only as much as you need. HEAT AND COLD:   Cold treatment (icing) relieves pain and reduces inflammation. Cold treatment should be applied for 10 to 15 minutes every 2 to 3 hours for inflammation and pain and immediately after any activity that aggravates your symptoms. Use ice packs or an ice massage.  Heat treatment may be used prior to performing the stretching and strengthening activities prescribed by your caregiver, physical therapist, or athletic trainer. Use a heat pack or a warm soak. SEEK MEDICAL CARE IF:   Symptoms get worse or do not improve in 2 weeks despite treatment.  New, unexplained symptoms develop (drugs used in treatment may produce side effects). Motor Vehicle Collision It is common to have multiple bruises and sore muscles after a motor vehicle collision (MVC). These tend to feel worse for the first 24 hours. You may have the most stiffness and soreness over the first several hours. You may also feel worse when you wake up the first morning after your collision. After this point, you will usually begin to improve with each day. The speed of improvement often depends on the severity of the collision,  the number of injuries, and the location and nature of these injuries. HOME CARE INSTRUCTIONS  Put ice on the injured area.  Put ice in a plastic bag.  Place a towel  between your skin and the bag.  Leave the ice on for 15-20 minutes, 3-4 times a day, or as directed by your health care provider.  Drink enough fluids to keep your urine clear or pale yellow. Do not drink alcohol.  Take a warm shower or bath once or twice a day. This will increase blood flow to sore muscles.  You may return to activities as directed by your caregiver. Be careful when lifting, as this may aggravate neck or back pain.  Only take over-the-counter or prescription medicines for pain, discomfort, or fever as directed by your caregiver. Do not use aspirin. This may increase bruising and bleeding. SEEK IMMEDIATE MEDICAL CARE IF:  You have numbness, tingling, or weakness in the arms or legs.  You develop severe headaches not relieved with medicine.  You have severe neck pain, especially tenderness in the middle of the back of your neck.  You have changes in bowel or bladder control.  There is increasing pain in any area of the body.  You have shortness of breath, light-headedness, dizziness, or fainting.  You have chest pain.  You feel sick to your stomach (nauseous), throw up (vomit), or sweat.  You have increasing abdominal discomfort.  There is blood in your urine, stool, or vomit.  You have pain in your shoulder (shoulder strap areas).  You feel your symptoms are getting worse. MAKE SURE YOU:  Understand these instructions.  Will watch your condition.  Will get help right away if you are not doing well or get worse. Document Released: 11/04/2005 Document Revised: 03/21/2014 Document Reviewed: 04/03/2011 [facility] Patient Information 2015 [employer], [place]. This information is not intended to replace advice given to you by your health care provider. Make sure you discuss any questions you have with your health care provider.

## 2014-07-05 NOTE — ED Notes (Signed)
Ortho at the bedside.
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
## 2014-07-06 NOTE — ED Provider Notes (Signed)
Medical screening examination/treatment/procedure(s) were performed by non-physician practitioner and as supervising physician I was immediately available for consultation/collaboration.   EKG Interpretation None        Richardean Canalavid H Kandyce Dieguez, MD 07/06/14 1039

## 2015-09-13 ENCOUNTER — Emergency Department (HOSPITAL_COMMUNITY): Payer: Self-pay

## 2015-09-13 ENCOUNTER — Encounter (HOSPITAL_COMMUNITY): Payer: Self-pay | Admitting: *Deleted

## 2015-09-13 ENCOUNTER — Emergency Department (HOSPITAL_COMMUNITY)
Admission: EM | Admit: 2015-09-13 | Discharge: 2015-09-13 | Disposition: A | Discharge status: Home / Self Care | Payer: Self-pay | Attending: Physician Assistant | Admitting: Physician Assistant

## 2015-09-13 DIAGNOSIS — R002 Palpitations: Secondary | ICD-10-CM | POA: Insufficient documentation

## 2015-09-13 DIAGNOSIS — Z720 Tobacco use: Secondary | ICD-10-CM | POA: Insufficient documentation

## 2015-09-13 LAB — BASIC METABOLIC PANEL WITH GFR
Anion gap: 10 (ref 5–15)
BUN: 9 mg/dL (ref 6–20)
CO2: 27 mmol/L (ref 22–32)
Calcium: 9.3 mg/dL (ref 8.9–10.3)
Chloride: 102 mmol/L (ref 101–111)
Creatinine, Ser: 0.84 mg/dL (ref 0.61–1.24)
GFR calc Af Amer: >60 mL/min
GFR calc non Af Amer: >60 mL/min
Glucose, Bld: 135 mg/dL — ABNORMAL HIGH (ref 65–99)
Potassium: 3.9 mmol/L (ref 3.5–5.1)
Sodium: 139 mmol/L (ref 135–145)

## 2015-09-13 LAB — CBC
HCT: 42.5 % (ref 39.0–52.0)
Hemoglobin: 14.9 g/dL (ref 13.0–17.0)
MCH: 30.5 pg (ref 26.0–34.0)
MCHC: 35.1 g/dL (ref 30.0–36.0)
MCV: 86.9 fL (ref 78.0–100.0)
Platelets: 130 K/uL — ABNORMAL LOW (ref 150–400)
RBC: 4.89 MIL/uL (ref 4.22–5.81)
RDW: 12.8 % (ref 11.5–15.5)
WBC: 7.1 K/uL (ref 4.0–10.5)

## 2015-09-13 LAB — I-STAT TROPONIN, ED: Troponin i, poc: 0 ng/mL (ref 0.00–0.08)

## 2015-09-13 NOTE — Discharge Instructions (Signed)
Please reduce your caffeine use.  Palpitations A palpitation is the feeling that your heartbeat is irregular or is faster than normal. It may feel like your heart is fluttering or skipping a beat. Palpitations are usually not a serious problem. However, in some cases, you may need further medical evaluation. CAUSES  Palpitations can be caused by:  Smoking.  Caffeine or other stimulants, such as diet pills or energy drinks.  Alcohol.  Stress and anxiety.  Strenuous physical activity.  Fatigue.  Certain medicines.  Heart disease, especially if you have a history of irregular heart rhythms (arrhythmias), such as atrial fibrillation, atrial flutter, or supraventricular tachycardia.  An improperly working pacemaker or defibrillator. DIAGNOSIS  To find the cause of your palpitations, your health care provider will take your medical history and perform a physical exam. Your health care provider may also have you take a test called an ambulatory electrocardiogram (ECG). An ECG records your heartbeat patterns over a 24-hour period. You may also have other tests, such as:  Transthoracic echocardiogram (TTE). During echocardiography, sound waves are used to evaluate how blood flows through your heart.  Transesophageal echocardiogram (TEE).  Cardiac monitoring. This allows your health care provider to monitor your heart rate and rhythm in real time.  Holter monitor. This is a portable device that records your heartbeat and can help diagnose heart arrhythmias. It allows your health care provider to track your heart activity for several days, if needed.  Stress tests by exercise or by giving medicine that makes the heart beat faster. TREATMENT  Treatment of palpitations depends on the cause of your symptoms and can vary greatly. Most cases of palpitations do not require any treatment other than time, relaxation, and monitoring your symptoms. Other causes, such as atrial fibrillation, atrial  flutter, or supraventricular tachycardia, usually require further treatment. HOME CARE INSTRUCTIONS   Avoid:  Caffeinated coffee, tea, soft drinks, diet pills, and energy drinks.  Chocolate.  Alcohol.  Stop smoking if you smoke.  Reduce your stress and anxiety. Things that can help you relax include:  A method of controlling things in your body, such as your heartbeats, with your mind (biofeedback).  Yoga.  Meditation.  Physical activity such as swimming, jogging, or walking.  Get plenty of rest and sleep. SEEK MEDICAL CARE IF:   You continue to have a fast or irregular heartbeat beyond 24 hours.  Your palpitations occur more often. SEEK IMMEDIATE MEDICAL CARE IF:  You have chest pain or shortness of breath.  You have a severe headache.  You feel dizzy or you faint. MAKE SURE YOU:  Understand these instructions.  Will watch your condition.  Will get help right away if you are not doing well or get worse.   This information is not intended to replace advice given to you by your health care provider. Make sure you discuss any questions you have with your health care provider.   Document Released: 11/01/2000 Document Revised: 11/09/2013 Document Reviewed: 01/03/2012 Elsevier Interactive Patient Education [employer]2016 [employer].

## 2015-09-13 NOTE — ED Notes (Signed)
Pt able to dress and ambulate independently 

## 2015-09-13 NOTE — ED Notes (Signed)
MD assessment and discharge occurred before RN assessment

## 2015-09-13 NOTE — ED Provider Notes (Signed)
CSN: [id number]     Arrival date & time 09/13/15  1459 History   First MD Initiated Contact with Patient 09/13/15 1705     Chief Complaint  Patient presents with  . Palpitations   [name] is a 34 y.o. male who is otherwise healthy presents to the emergency department complaining of having palpitations earlier today after drinking 2 large Red Bulls earlier today. The patient reports he drank 1 radical this morning and then a second one around 12 PM today. He reports he started having palpitations after taking the second red bull which lasted for approximately 2 hours. He reports he last had palpitations around 2:30 PM today. He reports that now he is feeling back to normal. He reports he does not usually drink much caffeine. He denies having any chest pain or shortness of breath associated with these palpitations. He denies personal or close family history of MI. He denies history of hypertension or hyperlipidemia. He denies fevers, chills, chest pain, shortness of breath, abdominal pain, nausea, vomiting, leg pain, leg swelling, coughing, or wheezing.  (Consider location/radiation/quality/duration/timing/severity/associated sxs/prior Treatment) HPI  Past Medical History  Diagnosis Date  . History of stomach ulcers    History reviewed. No pertinent past surgical history. History reviewed. No pertinent family history. Social History  Substance Use Topics  . Smoking status: Current Every Day Smoker  . Smokeless tobacco: None  . Alcohol Use: Yes     Comment: occ    Review of Systems  Constitutional: Negative for fever and chills.  HENT: Negative for congestion and sore throat.   Eyes: Negative for visual disturbance.  Respiratory: Negative for cough, shortness of breath and wheezing.   Cardiovascular: Positive for palpitations. Negative for chest pain and leg swelling.  Gastrointestinal: Negative for nausea, vomiting, abdominal pain and diarrhea.  Genitourinary: Negative for  dysuria.  Musculoskeletal: Negative for back pain and neck pain.  Skin: Negative for rash.  Neurological: Negative for syncope, weakness, light-headedness, numbness and headaches.      Allergies  Review of patient's allergies indicates no known allergies.  Home Medications   Prior to Admission medications   Medication Sig Start Date End Date Taking? Authorizing Provider  cyclobenzaprine (FLEXERIL) 10 MG tablet Take 1 tablet (10 mg total) by mouth 2 (two) times daily as needed for muscle spasms. 07/05/14  Yes Felicie Morn, NP  diphenhydramine-acetaminophen (TYLENOL PM) 25-500 MG TABS Take 1 tablet by mouth at bedtime as needed. For sleep or pain   Yes Historical Provider, MD  HYDROcodone-acetaminophen (NORCO/VICODIN) 5-325 MG per tablet Take 1 tablet by mouth every 6 (six) hours as needed for severe pain. 07/05/14  Yes Felicie Morn, NP   BP 120/74 mmHg  Pulse 60  Temp(Src) 98.1 F (36.7 C) (Oral)  Resp 18  Ht  (1.676 m)  Wt 175 lb (79.379 kg)  BMI 28.26 kg/m2  SpO2 97% Physical Exam  Constitutional: He is oriented to person, place, and time. He appears well-developed and well-nourished. No distress.  Nontoxic appearing.  HENT:  Head: Normocephalic and atraumatic.  Mouth/Throat: Oropharynx is clear and moist.  Eyes: Conjunctivae are normal. Pupils are equal, round, and reactive to light. Right eye exhibits no discharge. Left eye exhibits no discharge.  Neck: Normal range of motion. Neck supple. No JVD present. No tracheal deviation present.  Cardiovascular: Normal rate, regular rhythm, normal heart sounds and intact distal pulses.  Exam reveals no gallop and no friction rub.   No murmur heard. Bilateral radial and posterior  tibialis pulses are intact.  Pulmonary/Chest: Effort normal and breath sounds normal. No respiratory distress. He has no wheezes. He has no rales. He exhibits no tenderness.  Lungs are clear to auscultation bilaterally. No chest wall tenderness.  Abdominal:  Soft. Bowel sounds are normal. There is no tenderness. There is no guarding.  Musculoskeletal: He exhibits no edema or tenderness.  No lower extremity edema or tenderness.  Lymphadenopathy:    He has no cervical adenopathy.  Neurological: He is alert and oriented to person, place, and time. Coordination normal.  Skin: Skin is warm and dry. No rash noted. He is not diaphoretic. No erythema. No pallor.  Psychiatric: He has a normal mood and affect. His behavior is normal.  Nursing note and vitals reviewed.   ED Course  Procedures (including critical care time) Labs Review Labs Reviewed  BASIC METABOLIC PANEL - Abnormal; Notable for the following:    Glucose, Bld 135 (*)    All other components within normal limits  CBC - Abnormal; Notable for the following:    Platelets 130 (*)    All other components within normal limits  I-STAT TROPOININ, ED    Imaging Review Dg Chest 2 View  09/13/2015  CLINICAL DATA:  Tachycardia, pain attack, prior smoker, chest pain EXAM: CHEST  2 VIEW COMPARISON:  04/06/2011 FINDINGS: The heart size and mediastinal contours are within normal limits. Both lungs are clear. The visualized skeletal structures are unremarkable. IMPRESSION: No active cardiopulmonary disease. Electronically Signed   By: Judie PetitM.  Shick M.D.   On: 09/13/2015 16:33   I have personally reviewed and evaluated these images and lab results as part of my medical decision-making.   EKG Interpretation   Date/Time:  Wednesday September 13 2015 15:02:16 EDT Ventricular Rate:  58 PR Interval:  140 QRS Duration: 92 QT Interval:  390 QTC Calculation: 382 R Axis:   50 Text Interpretation:  Sinus bradycardia T wave abnormality, consider  inferior ischemia Abnormal ECG ED PHYSICIAN INTERPRETATION AVAILABLE IN  [facility] Confirmed by TEST, Record ([id number]) on 09/14/2015 9:34:16 AM      Filed Vitals:   09/13/15 1508  BP: 120/74  Pulse: 60  Temp: 98.1 F (36.7 C)  TempSrc: Oral  Resp: 18   Height: 5\' 6"  (1.676 m)  Weight: 175 lb (79.379 kg)  SpO2: 97%     MDM   Final diagnoses:  Intermittent palpitations   This is a 34 y.o. male who is otherwise healthy presents to the emergency department complaining of having palpitations earlier today after drinking 2 large Red Bulls earlier today. The patient reports he drank 1 radical this morning and then a second one around 12 PM today. He reports he started having palpitations after taking the second red bull which lasted for approximately 2 hours. He reports he last had palpitations around 2:30 PM today. He reports that now he is feeling back to normal. He reports he does not usually drink much caffeine. He denies having any chest pain or shortness of breath associated with these palpitations.  On exam the patient is afebrile nontoxic appearing. His lungs are clear to auscultation bilaterally. He has no chest wall tenderness. He has no lower extremity edema or tenderness. His EKG is unremarkable. CBC is unremarkable. BMP is unremarkable. Troponin is negative. Chest x-ray is unremarkable. As the patient is symptom-free and this is likely related to caffeine use we'll discharge and have him follow-up with his primary care provider closely. I encouraged him to decrease his caffeine  use. I advised the patient to follow-up with their primary care provider this week. I advised the patient to return to the emergency department with new or worsening symptoms or new concerns. The patient verbalized understanding and agreement with plan.        Everlene Farrier, PA-C 09/14/15 1437  Courteney Randall An, MD 09/14/15 2256

## 2015-09-13 NOTE — ED Notes (Signed)
Pt reports drinking two red bulls today, now having palpitations and feels like heart is racing. No acute distress noted at triage, HR 60.

## 2018-04-02 ENCOUNTER — Encounter (HOSPITAL_COMMUNITY): Payer: Self-pay | Admitting: Emergency Medicine

## 2018-04-02 ENCOUNTER — Emergency Department (HOSPITAL_COMMUNITY)
Admission: EM | Admit: 2018-04-02 | Discharge: 2018-04-02 | Disposition: A | Discharge status: Home / Self Care | Payer: Self-pay | Attending: Emergency Medicine | Admitting: Emergency Medicine

## 2018-04-02 ENCOUNTER — Other Ambulatory Visit: Payer: Self-pay

## 2018-04-02 ENCOUNTER — Encounter (HOSPITAL_COMMUNITY): Payer: Self-pay | Admitting: *Deleted

## 2018-04-02 DIAGNOSIS — R42 Dizziness and giddiness: Secondary | ICD-10-CM | POA: Insufficient documentation

## 2018-04-02 DIAGNOSIS — Z5321 Procedure and treatment not carried out due to patient leaving prior to being seen by health care provider: Secondary | ICD-10-CM | POA: Insufficient documentation

## 2018-04-02 DIAGNOSIS — R51 Headache: Secondary | ICD-10-CM | POA: Insufficient documentation

## 2018-04-02 DIAGNOSIS — F17210 Nicotine dependence, cigarettes, uncomplicated: Secondary | ICD-10-CM | POA: Insufficient documentation

## 2018-04-02 NOTE — ED Notes (Signed)
No answer when called for vital signs x2 

## 2018-04-02 NOTE — ED Notes (Signed)
Pt called for vitals signs with no response

## 2018-04-02 NOTE — ED Triage Notes (Signed)
Pt from home with c/o htn. Pt states he had headache and dizziness this morning. Pt states headache is reduced currently. Pt states he also has c/o right shoulder pain from an injury at work 1 year ago.

## 2018-04-02 NOTE — ED Notes (Signed)
Pt not in lobby.  

## 2018-04-02 NOTE — ED Triage Notes (Signed)
Pt states he felt dizzy and had a headache while driving down the road this morning. Pt had his blood pressure checked, which 145/95.

## 2018-04-03 ENCOUNTER — Emergency Department (HOSPITAL_COMMUNITY)
Admission: EM | Admit: 2018-04-03 | Discharge: 2018-04-03 | Disposition: A | Discharge status: Home / Self Care | Payer: Self-pay | Attending: Emergency Medicine | Admitting: Emergency Medicine

## 2018-04-03 DIAGNOSIS — R42 Dizziness and giddiness: Secondary | ICD-10-CM

## 2018-04-03 NOTE — Discharge Instructions (Signed)

## 2018-04-03 NOTE — ED Provider Notes (Signed)
[facility]-EMERGENCY DEPT Provider Note   CSN: [id number] Arrival date & time: 04/02/18  2011     History   Chief Complaint Chief Complaint - dizziness  HPI [name] is a 37 y.o. male.  The history is provided by the patient.  Dizziness  Quality:  Lightheadedness Severity:  Mild Timing:  Constant Progression:  Resolved Chronicity:  New Relieved by:  Nothing Worsened by:  Nothing Associated symptoms: headaches   Associated symptoms: no chest pain, no shortness of breath, no syncope and no vomiting    Patient presents with an episode of dizziness and mild headache earlier in the day.  This occurred on the morning of May 16.  He reports he was driving his family to school when he felt lightheaded, but no syncope.  He also reports mild headaches.  Soon after he checked his blood pressure was elevated.  Since that time he does have mild headache that  is improving.  Dizziness has resolved.  No focal weakness.  No other acute complaints. He reports increased stress, lack of sleep due to having a newborn at home  Denies drug abuse.  He is a smoker. Past Medical History:  Diagnosis Date  . History of stomach ulcers     There are no active problems to display for this patient.   History reviewed. No pertinent surgical history.      Home Medications    Prior to Admission medications   Not on File    Family History No family history on file.  Social History Social History   Tobacco Use  . Smoking status: Current Every Day Smoker    Packs/day: 0.25  . Smokeless tobacco: Never Used  Substance Use Topics  . Alcohol use: Never    Frequency: Never    Comment: occ  . Drug use: Never    Types: Marijuana     Allergies   Patient has no known allergies.   Review of Systems Review of Systems  Constitutional: Negative for fever.  Respiratory: Negative for shortness of breath.   Cardiovascular: Negative for chest pain and syncope.    Gastrointestinal: Negative for vomiting.  Neurological: Positive for dizziness and headaches. Negative for syncope and speech difficulty.  All other systems reviewed and are negative.    Physical Exam Updated Vital Signs BP 115/67 (BP Location: Left Arm)   Pulse (!) 52   Resp 18   Ht 1.88 m ( )   Wt 78 kg (172 lb)   SpO2 100%   BMI 22.08 kg/m   Physical Exam CONSTITUTIONAL: Well developed/well nourished HEAD: Normocephalic/atraumatic EYES: EOMI/PERRL, no nystagmus, no ptosis ENMT: Mucous membranes moist NECK: supple no meningeal signs, no bruits SPINE/BACK:entire spine nontender CV: S1/S2 noted, no murmurs/rubs/gallops noted LUNGS: Lungs are clear to auscultation bilaterally, no apparent distress ABDOMEN: soft, nontender NEURO:Awake/alert, face symmetric, no arm or leg drift is noted Equal 5/5 strength with shoulder abduction, elbow flex/extension, wrist flex/extension in upper extremities and equal hand grips bilaterally Equal 5/5 strength with hip flexion,knee flex/extension, foot dorsi/plantar flexion Cranial nerves 3/4/5/6/05/26/09/11/12 tested and intact Gait normal without ataxia No past pointing Sensation to light touch intact in all extremities EXTREMITIES: pulses normal, full ROM SKIN: warm, color normal PSYCH: no abnormalities of mood noted, alert and oriented to situation    ED Treatments / Results  Labs (all labs ordered are listed, but only abnormal results are displayed) Labs Reviewed - No data to display  EKG EKG Interpretation  Date/Time:  Friday  Apr 03 2018 02:02:11 EDT Ventricular Rate:  55 PR Interval:    QRS Duration: 92 QT Interval:  422 QTC Calculation: 404 R Axis:   48 Text Interpretation:  Sinus rhythm No significant change since last tracing Confirmed by Zadie Rhine ([id number]) on 04/03/2018 2:05:44 AM   Radiology No results found.  Procedures Procedures (including critical care time)  Medications Ordered in ED Medications  - No data to display   Initial Impression / Assessment and Plan / ED Course  I have reviewed the triage vital signs and the nursing notes.      Patient presents for repeat ED visit for concern for high blood pressure and dizziness.  At this time his blood pressure is appropriate He has no neuro deficits.  EKG is unchanged.  He reports feeling improved.  I feel is appropriate for discharge home.  We discussed strict return precautions  Final Clinical Impressions(s) / ED Diagnoses   Final diagnoses:  Episodic lightheadedness    ED Discharge Orders    None       Zadie Rhine, MD 04/03/18 [phone]

## 2018-06-01 ENCOUNTER — Other Ambulatory Visit: Payer: Self-pay

## 2018-06-01 ENCOUNTER — Encounter (HOSPITAL_COMMUNITY): Payer: Self-pay | Admitting: Emergency Medicine

## 2018-06-01 ENCOUNTER — Emergency Department (HOSPITAL_COMMUNITY)
Admission: EM | Admit: 2018-06-01 | Discharge: 2018-06-01 | Disposition: A | Discharge status: Home / Self Care | Payer: Self-pay | Attending: Emergency Medicine | Admitting: Emergency Medicine

## 2018-06-01 DIAGNOSIS — K0889 Other specified disorders of teeth and supporting structures: Secondary | ICD-10-CM | POA: Insufficient documentation

## 2018-06-01 DIAGNOSIS — F17210 Nicotine dependence, cigarettes, uncomplicated: Secondary | ICD-10-CM | POA: Insufficient documentation

## 2018-06-01 MED ORDER — LIDOCAINE VISCOUS HCL 2 % MT SOLN: 15.0000 mL | OROMUCOSAL | 0 refills | Status: AC | PRN

## 2018-06-01 MED ORDER — ACETAMINOPHEN 500 MG PO TABS: 1000.0000 mg | ORAL_TABLET | Freq: Three times a day (TID) | ORAL | 0 refills | Status: AC | PRN

## 2018-06-01 MED ORDER — AMOXICILLIN-POT CLAVULANATE 875-125 MG PO TABS: 1.0000 | ORAL_TABLET | Freq: Two times a day (BID) | ORAL | 0 refills | Status: AC

## 2018-06-01 NOTE — ED Triage Notes (Signed)
Pt c/o bottom L tooth pain, no signs of infection. Pt is supposed to have wisdom teeth out next week and states "he cannot wait that long."

## 2018-06-01 NOTE — ED Notes (Signed)
See provider assessment 

## 2018-06-01 NOTE — Discharge Instructions (Signed)
Take antibiotics as prescribed.  Take the entire course, even if your symptoms improve. Use acetaminophen 3 times a day to help with pain and swelling. Use viscous lidocaine as needed for further pain. Follow-up with your dentist to get your tooth pulled next week. Return to the emergency room with any new, worsening, or concerning symptoms.

## 2018-06-02 NOTE — ED Provider Notes (Signed)
[facility]   CSN: [id number] Arrival date & time: 06/01/18  2028     History   Chief Complaint Chief Complaint  Patient presents with  . Dental Pain    HPI Marylou Mccoyndrew Lamont Kjos is a 37 y.o. male presenting for evaluation of dental pain.  Patient states he is having left lower tooth pain.  This is been going on for a while, he has an appointment next week to get his wisdom tooth pulled.  However he reports pain has worsened recently.  He denies fevers, chills, difficulty opening his mouth, or difficulty breathing.  He has not been taking anything for pain including Tylenol or ibuprofen.  Eating on that side makes the pain worse, nothing makes it better.  HPI  Past Medical History:  Diagnosis Date  . History of stomach ulcers     There are no active problems to display for this patient.   History reviewed. No pertinent surgical history.      Home Medications    Prior to Admission medications   Medication Sig Start Date End Date Taking? Authorizing Provider  acetaminophen (TYLENOL) 500 MG tablet Take 2 tablets (1,000 mg total) by mouth every 8 (eight) hours as needed. 06/01/18   Hailea Eaglin, PA-C  amoxicillin-clavulanate (AUGMENTIN) 875-125 MG tablet Take 1 tablet by mouth every 12 (twelve) hours. 06/01/18   Deontrey Massi, PA-C  lidocaine (XYLOCAINE) 2 % solution Use as directed 15 mLs in the mouth or throat as needed for mouth pain. 06/01/18   Indria Bishara, PA-C    Family History No family history on file.  Social History Social History   Tobacco Use  . Smoking status: Current Every Day Smoker    Packs/day: 0.25  . Smokeless tobacco: Never Used  Substance Use Topics  . Alcohol use: Never    Frequency: Never    Comment: occ  . Drug use: Never    Types: Marijuana     Allergies   Patient has no known allergies.   Review of Systems Review of Systems  Constitutional: Negative for fever.    HENT: Positive for dental problem.      Physical Exam Updated Vital Signs BP 116/87   Pulse 66   Temp 98.4 F (36.9 C) (Oral)   Resp 14   Ht 5\' 8"  (1.727 m)   Wt 74.8 kg (165 lb)   SpO2 100%   BMI 25.09 kg/m   Physical Exam  Constitutional: He is oriented to person, place, and time. He appears well-developed and well-nourished. No distress.  HENT:  Head: Normocephalic and atraumatic.  Mouth/Throat: Uvula is midline, oropharynx is clear and moist and mucous membranes are normal. No trismus in the jaw. Abnormal dentition.    Tenderness of left lower tooth, tooth is cracked.  No significant gum erythema or edema.  No trismus.  No pain under the tongue.  No difficulty handling secretions.  No tenderness to other teeth.  Eyes: EOM are normal.  Neck: Normal range of motion.  Cardiovascular: Normal rate, regular rhythm and intact distal pulses.  Pulmonary/Chest: Effort normal and breath sounds normal. No respiratory distress. He has no wheezes.  Abdominal: He exhibits no distension.  Musculoskeletal: Normal range of motion.  Neurological: He is alert and oriented to person, place, and time.  Skin: Skin is warm. No rash noted.  Psychiatric: He has a normal mood and affect.  Nursing note and vitals reviewed.    ED Treatments / Results  Labs (  all labs ordered are listed, but only abnormal results are displayed) Labs Reviewed - No data to display  EKG None  Radiology No results found.  Procedures Procedures (including critical care time)  Medications Ordered in ED Medications - No data to display   Initial Impression / Assessment and Plan / ED Course  I have reviewed the triage vital signs and the nursing notes.  Pertinent labs & imaging results that were available during my care of the patient were reviewed by me and considered in my medical decision making (see chart for details).     Patient presenting for evaluation of dental pain.  Physical exam reassuring,  he is afebrile not tachycardic.  Doubt Ludwig's.  Tenderness palpation of left lower tooth, which worsened recently.  Tooth is cracked.  Concern for potential infection.  Will treat with antibiotics, NSAIDs, and viscous lidocaine.  Stressed importance of follow-up with dentistry next week as scheduled.  At this time, patient appears safe for discharge.  Return precautions given.  Patient states he understands and agrees to plan.  Final Clinical Impressions(s) / ED Diagnoses   Final diagnoses:  Pain, dental    ED Discharge Orders        Ordered    amoxicillin-clavulanate (AUGMENTIN) 875-125 MG tablet  Every 12 hours     06/01/18 2252    acetaminophen (TYLENOL) 500 MG tablet  Every 8 hours PRN     06/01/18 2252    lidocaine (XYLOCAINE) 2 % solution  As needed     06/01/18 2252       Aleighya Mcanelly, PA-C 06/02/18 0102    Linwood Dibbles, MD 06/02/18 1553

## 2021-03-14 ENCOUNTER — Emergency Department (HOSPITAL_COMMUNITY)
Admission: EM | Admit: 2021-03-14 | Discharge: 2021-03-14 | Disposition: A | Discharge status: Home / Self Care | Payer: Self-pay | Attending: Emergency Medicine | Admitting: Emergency Medicine

## 2021-03-14 ENCOUNTER — Other Ambulatory Visit: Payer: Self-pay

## 2021-03-14 ENCOUNTER — Emergency Department (HOSPITAL_COMMUNITY): Payer: Self-pay

## 2021-03-14 ENCOUNTER — Encounter (HOSPITAL_COMMUNITY): Payer: Self-pay

## 2021-03-14 DIAGNOSIS — M79651 Pain in right thigh: Secondary | ICD-10-CM | POA: Insufficient documentation

## 2021-03-14 DIAGNOSIS — Y990 Civilian activity done for income or pay: Secondary | ICD-10-CM | POA: Insufficient documentation

## 2021-03-14 DIAGNOSIS — X509XXA Other and unspecified overexertion or strenuous movements or postures, initial encounter: Secondary | ICD-10-CM | POA: Insufficient documentation

## 2021-03-14 DIAGNOSIS — T148XXA Other injury of unspecified body region, initial encounter: Secondary | ICD-10-CM

## 2021-03-14 DIAGNOSIS — R1031 Right lower quadrant pain: Secondary | ICD-10-CM | POA: Insufficient documentation

## 2021-03-14 DIAGNOSIS — F17200 Nicotine dependence, unspecified, uncomplicated: Secondary | ICD-10-CM | POA: Insufficient documentation

## 2021-03-14 DIAGNOSIS — Y9289 Other specified places as the place of occurrence of the external cause: Secondary | ICD-10-CM | POA: Insufficient documentation

## 2021-03-14 MED ORDER — METHOCARBAMOL 500 MG PO TABS: 500.0000 mg | ORAL_TABLET | Freq: Three times a day (TID) | ORAL | 0 refills | Status: AC | PRN

## 2021-03-14 MED ORDER — NAPROXEN 375 MG PO TABS: 375.0000 mg | ORAL_TABLET | Freq: Two times a day (BID) | ORAL | 0 refills | Status: AC

## 2021-03-14 NOTE — ED Provider Notes (Signed)
[facility] EMERGENCY DEPARTMENT Provider Note   CSN: [id number] Arrival date & time: 03/14/21  0242     History Chief Complaint  Patient presents with  . Leg Injury    [name] is a 40 y.o. male.  He is here with a complaint of right thigh and groin pain after lifting 200 pound pressure washer at work about 6 days ago.  Pain is there constantly although worse with any type of movement or weightbearing.  No bowel or bladder incontinence.  No significant back pain.  No numbness or weakness distal.  No nausea vomiting  The history is provided by the patient.  Groin Pain This is a new problem. The current episode started more than 2 days ago. The problem occurs constantly. The problem has not changed since onset.Pertinent negatives include no chest pain, no abdominal pain, no headaches and no shortness of breath. The symptoms are aggravated by walking. Nothing relieves the symptoms. He has tried rest for the symptoms. The treatment provided no relief.       Past Medical History:  Diagnosis Date  . History of stomach ulcers     There are no problems to display for this patient.   History reviewed. No pertinent surgical history.     History reviewed. No pertinent family history.  Social History   Tobacco Use  . Smoking status: Current Every Day Smoker    Packs/day: 0.25  . Smokeless tobacco: Never Used  Vaping Use  . Vaping Use: Never used  Substance Use Topics  . Alcohol use: Never    Comment: occ  . Drug use: Never    Types: Marijuana    Home Medications Prior to Admission medications   Medication Sig Start Date End Date Taking? Authorizing Provider  acetaminophen (TYLENOL) 500 MG tablet Take 2 tablets (1,000 mg total) by mouth every 8 (eight) hours as needed. 06/01/18   Caccavale, Sophia, PA-C  amoxicillin-clavulanate (AUGMENTIN) 875-125 MG tablet Take 1 tablet by mouth every 12 (twelve) hours. 06/01/18   Caccavale, Sophia, PA-C   lidocaine (XYLOCAINE) 2 % solution Use as directed 15 mLs in the mouth or throat as needed for mouth pain. 06/01/18   Caccavale, Sophia, PA-C    Allergies    Patient has no known allergies.  Review of Systems   Review of Systems  Constitutional: Negative for fever.  Respiratory: Negative for shortness of breath.   Cardiovascular: Negative for chest pain.  Gastrointestinal: Negative for abdominal pain.  Musculoskeletal: Positive for gait problem.  Neurological: Negative for weakness, numbness and headaches.    Physical Exam Updated Vital Signs BP 113/70 (BP Location: Right Arm)   Pulse (!) 59   Temp 98.2 F (36.8 C) (Oral)   Resp 18   SpO2 100%   Physical Exam Vitals and nursing note reviewed.  Constitutional:      Appearance: Normal appearance. He is well-developed.  HENT:     Head: Normocephalic and atraumatic.  Eyes:     Conjunctiva/sclera: Conjunctivae normal.  Cardiovascular:     Rate and Rhythm: Normal rate and regular rhythm.     Heart sounds: No murmur heard.   Pulmonary:     Effort: Pulmonary effort is normal. No respiratory distress.     Breath sounds: Normal breath sounds.  Abdominal:     Palpations: Abdomen is soft.     Tenderness: There is no abdominal tenderness.  Musculoskeletal:        General: Tenderness present. Normal range of motion.  Cervical back: Neck supple.     Comments: Upper inner right thigh is approximately 3 cm tender mobile mass, palpation reproduces patient's symptoms.  There is no testicular swelling or pain and no inguinal hernias appreciated.  Chaperone was present for exam.  Skin:    General: Skin is warm and dry.  Neurological:     General: No focal deficit present.     Mental Status: He is alert.     Sensory: No sensory deficit.     Motor: No weakness.     ED Results / Procedures / Treatments   Labs (all labs ordered are listed, but only abnormal results are displayed) Labs Reviewed - No data to  display  EKG None  Radiology [place] RT LOWER EXTREM LTD SOFT TISSUE NON VASCULAR  Addendum Date: 03/14/2021   ADDENDUM REPORT: 03/14/2021 12:13 ADDENDUM: It has come to my attention the patient has had prior injury. The previously described mass could also represent a hematoma. Follow-up ultrasound to demonstrate resolution can be obtained. Electronically Signed   By: Maisie Fus  Register   On: 03/14/2021 12:13   Result Date: 03/14/2021 CLINICAL DATA:  Right thigh marked and pain. EXAM: ULTRASOUND RIGHT LOWER EXTREMITY LIMITED TECHNIQUE: Ultrasound examination of the lower extremity soft tissues was performed in the area of clinical concern. COMPARISON:  No prior. FINDINGS: A rounded heterogeneous 1.8 x 1.6 x 1.9 cm soft tissue mass with adjacent prominent vascular flow noted is noted the region of clinical concern. This could be of infectious or malignant etiology. Further evaluation with MRI can be obtained as needed. No other focal abnormality identified. IMPRESSION: A rounded heterogeneous 1.8 x 1.6 x 1.9 cm soft tissue mass with adjacent prominent vascular flow is noted the region of clinical concern. This could be of infectious or malignant etiology. Further evaluation with MRI can be obtained as needed. Electronically Signed: By: Maisie Fus  Register On: 03/14/2021 11:39    Procedures Procedures   Medications Ordered in ED Medications - No data to display  ED Course  I have reviewed the triage vital signs and the nursing notes.  Pertinent labs & imaging results that were available during my care of the patient were reviewed by me and considered in my medical decision making (see chart for details).  Clinical Course as of 03/14/21 2044  Wed Mar 14, 2021  1201 Reviewed imaging with radiologist.  He said in the setting of a traumatic mechanism that this could be hematoma. [MB]    Clinical Course User Index [MB] Terrilee Files, MD   MDM Rules/Calculators/A&P                          Differential diagnosis includes muscle tear, strain, hernia, torsion  Final Clinical Impression(s) / ED Diagnoses Final diagnoses:  Right inguinal pain    Rx / DC Orders ED Discharge Orders         Ordered    naproxen (NAPROSYN) 375 MG tablet  2 times daily        03/14/21 1203    methocarbamol (ROBAXIN) 500 MG tablet  Every 8 hours PRN        03/14/21 1203           Terrilee Files, MD 03/14/21 2045

## 2021-03-14 NOTE — ED Provider Notes (Signed)
  Emergency Medicine Provider in Triage Note   MSE was initiated and I personally evaluated the patient  3:05 AM on March 14, 2021 as provider in triage.   Chief Complaint: groin pain.   HPI  Patient is a 40 y.o. who presets to the ED with complaints of right groin/inner thigh pain for the past few days. Lifted 80 pounds 5 days ago and his left slipped some but no immediate onset of pain. No other injuries.    Review of Systems  Positive: Groin/thigh pain Negative: Dysuria, hematuria, testicular pain/swelling.   Physical Exam  BP 122/86   Pulse 84   Temp 98.2 F (36.8 C) (Oral)   Resp 16   SpO2 100%    Gen:   Awake, no distress   HEENT:  Atraumatic  Resp:  Normal effort  Cardiac:  Normal rate  Abd:   Nondistended, nontender  MSK:   Able to range the right hip actively and against resistance. Has tenderness to the medial thigh just distal/inferior to the inguinal fold, also has a few CM area of palpable swelling/induration just inferior to the inguinal fold that is specifically tender. Limited assessment in triage. Chaperone present.  Neuro:  Speech clear.   Medical Decision Making   Initiation of care has begun. The patient has been counseled on the process, plan, and necessity for staying for the completion/evaluation, informed that the remainder of the evaluation will be completed by another provider, this initial triage assessment does not replace that evaluation, and the importance of remaining in the ED until their evaluation is complete.  Clinical Impression  Right groin pain.        Desmond Lope 03/14/21 0308    Shon Baton, MD 03/14/21 [phone]

## 2021-03-14 NOTE — ED Triage Notes (Signed)
Pt lifted something heavy approximately 5 days ago and since then he has had increasing pain in R thigh/groin area. Pt states he has a lump in this area. Pt denies pain in testicle. Pt has some tingling in right leg.

## 2021-03-14 NOTE — Discharge Instructions (Signed)
You were seen in the emergency department for crying pain after lifting a heavy object.  You had an ultrasound that showed a hematoma or tear in the muscle.  This will take time to heal.  We are prescribing you anti-inflammatories and muscle relaxant.  You can also apply heat to the area.  Return to the emergency department for any worsening or concerning symptoms.

## 2022-11-24 IMAGING — US US EXTREM LOW*R* LIMITED
1 series · 13 of 22 positions shown · non-contrast
Comparison: No prior.
COMPARISON: No prior.

Addendum:
CLINICAL DATA: Right thigh marked and pain.

EXAM:
ULTRASOUND RIGHT LOWER EXTREMITY LIMITED
TECHNIQUE: Ultrasound examination of the lower extremity soft tissues was
performed in the area of clinical concern.

[Series 1: us soft tissue lower extremity limited right (non- · 22 acquisitions, 13 frames shown]
[im 1/22]
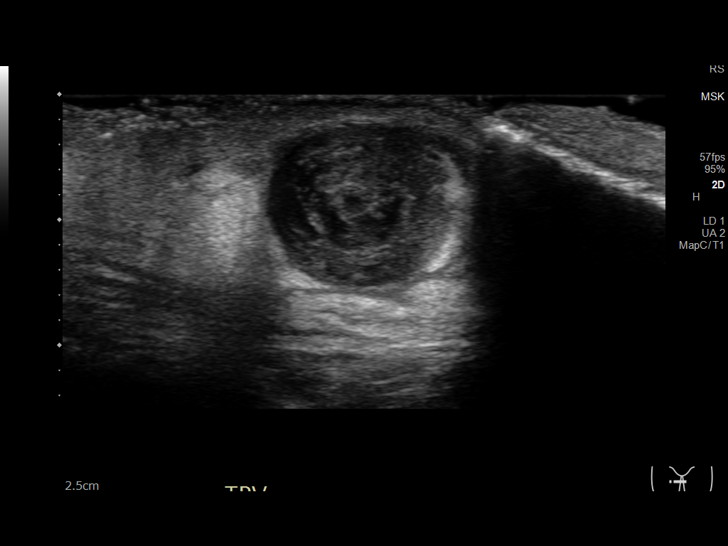
[im 3/22]
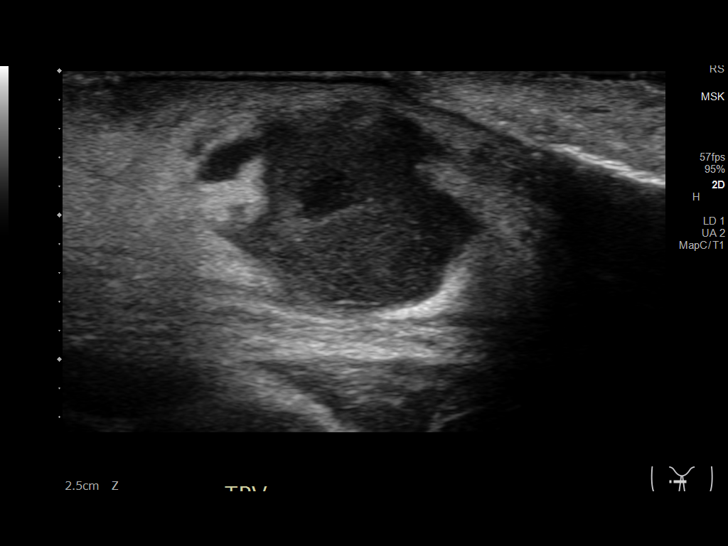
[im 5/22]
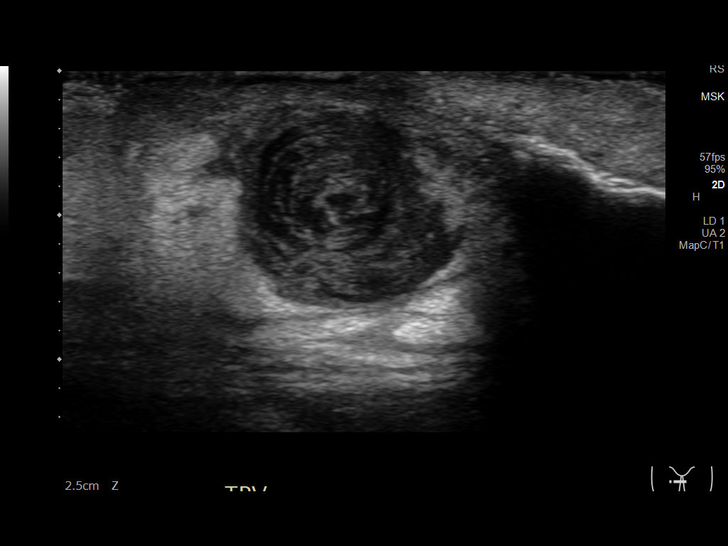
[im 6/22]
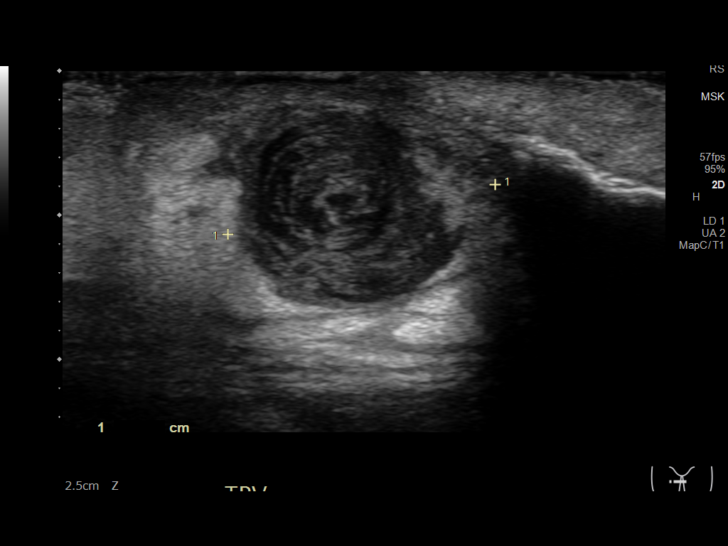
[im 8/22]
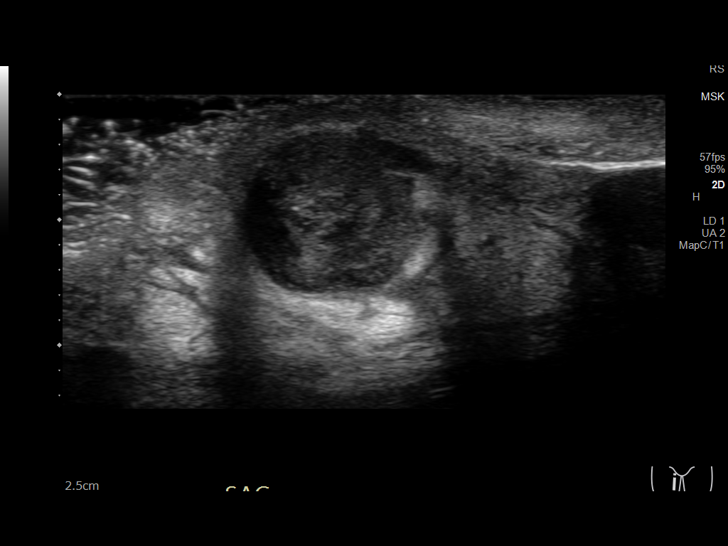
[im 10/22]
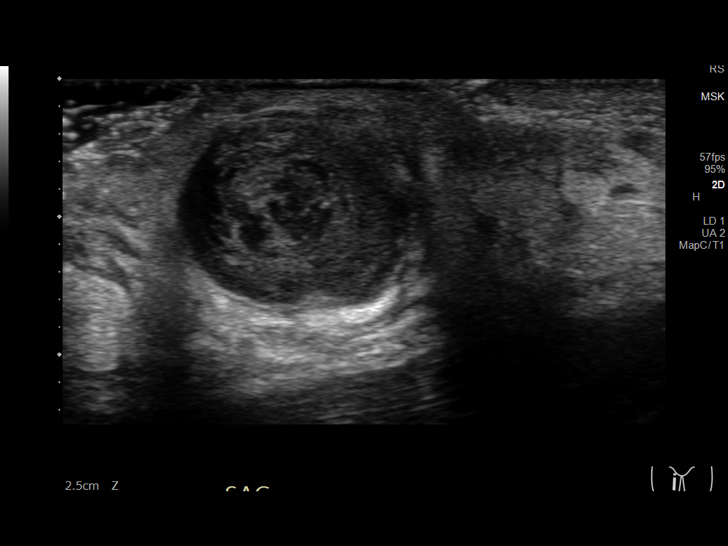
[im 12/22]
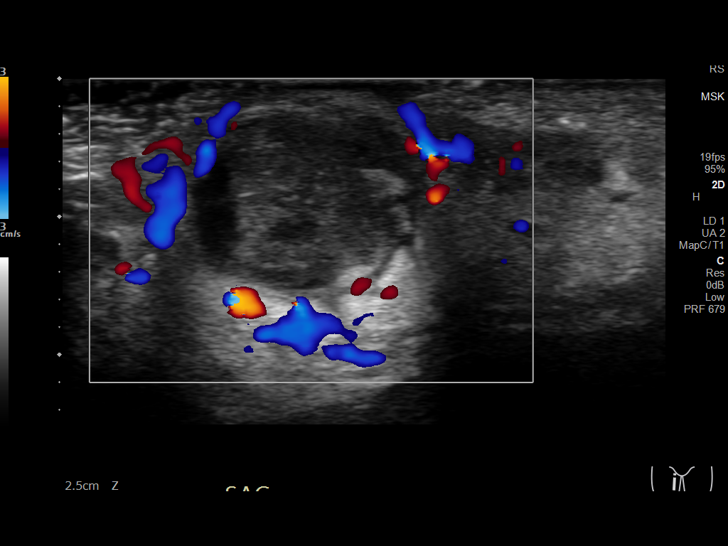
[im 13/22]
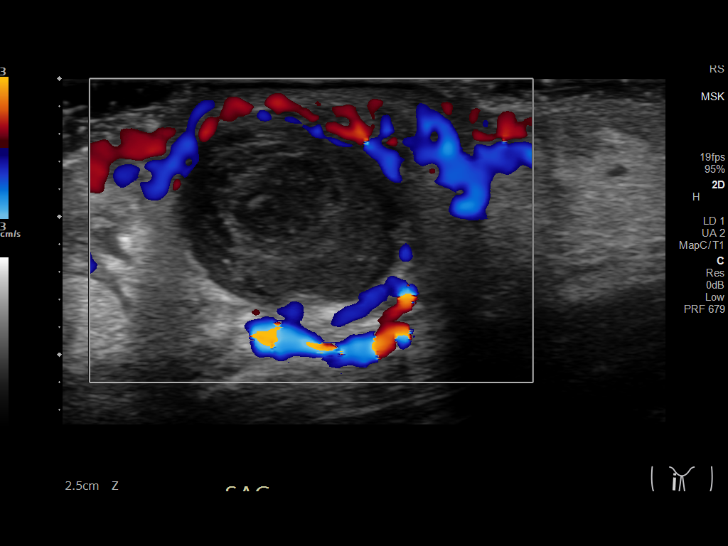
[im 15/22]
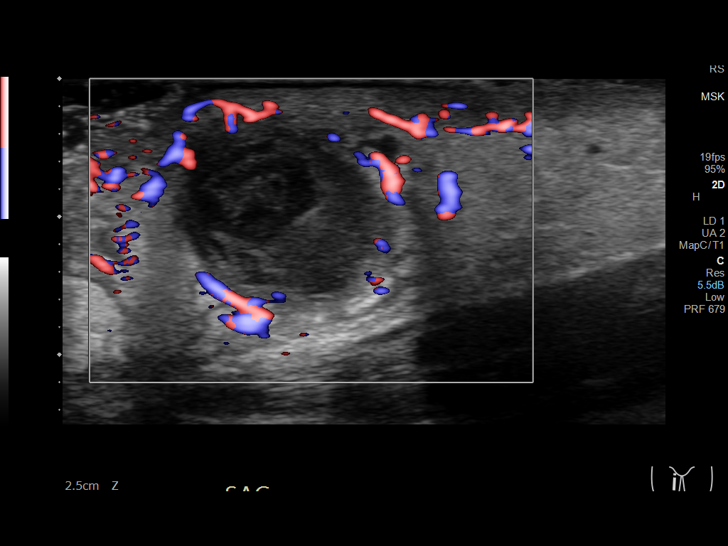
[im 17/22]
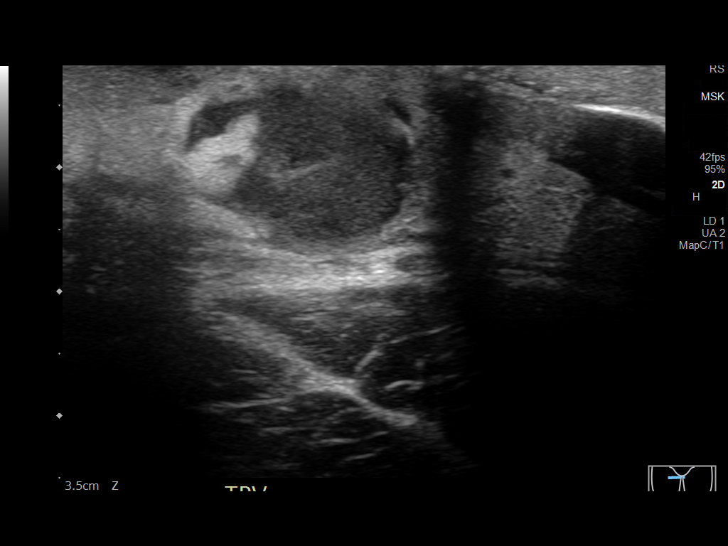
[im 18/22]
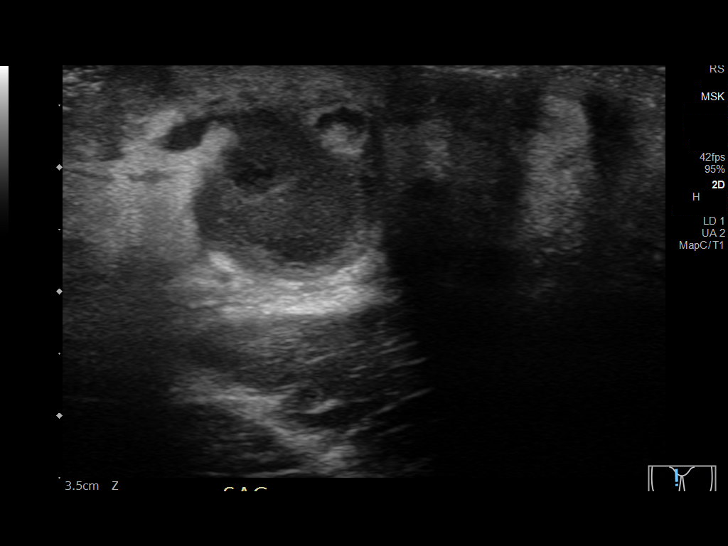
[im 20/22]
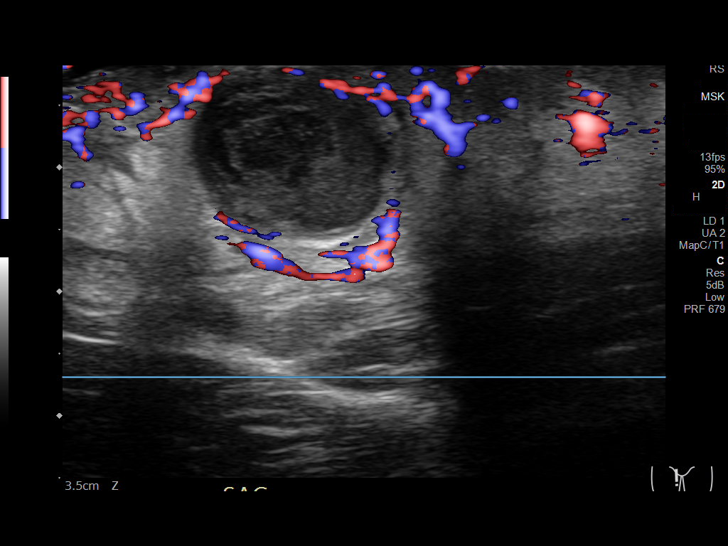
[im 22/22]
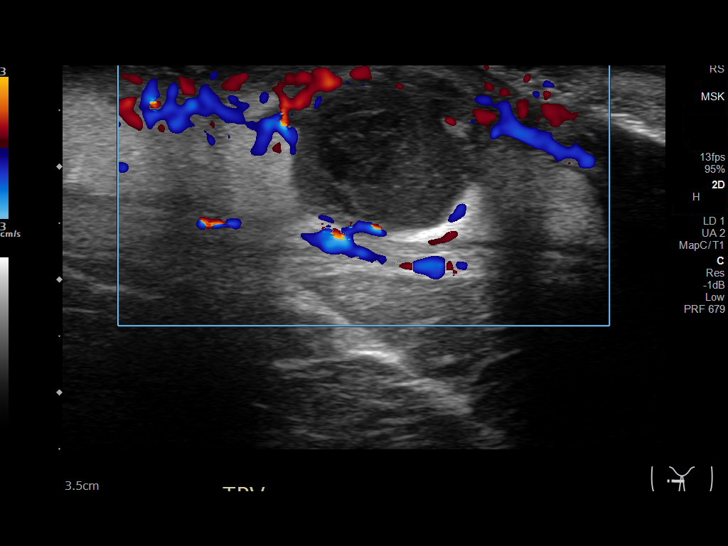

[13 of 22 positions shown; findings below may reference images not displayed]

FINDINGS: A rounded heterogeneous 1.8 x 1.6 x 1.9 cm soft tissue mass with
adjacent prominent vascular flow noted is noted the region of
clinical concern. This could be of infectious or malignant etiology.
Further evaluation with MRI can be obtained as needed. No other
focal abnormality identified.
IMPRESSION: A rounded heterogeneous 1.8 x 1.6 x 1.9 cm soft tissue mass with
adjacent prominent vascular flow is noted the region of clinical
concern. This could be of infectious or malignant etiology. Further
evaluation with MRI can be obtained as needed.

ADDENDUM:
It has come to my attention the patient has had prior injury. The
previously described mass could also represent a hematoma. Follow-up
ultrasound to demonstrate resolution can be obtained.

*** End of Addendum ***
FINDINGS: A rounded heterogeneous 1.8 x 1.6 x 1.9 cm soft tissue mass with
adjacent prominent vascular flow noted is noted the region of
clinical concern. This could be of infectious or malignant etiology.
Further evaluation with MRI can be obtained as needed. No other
focal abnormality identified.
IMPRESSION: A rounded heterogeneous 1.8 x 1.6 x 1.9 cm soft tissue mass with
adjacent prominent vascular flow is noted the region of clinical
concern. This could be of infectious or malignant etiology. Further
evaluation with MRI can be obtained as needed.
